# Patient Record
Sex: Female | Born: 1990
Health system: Southern US, Community
[De-identification: ages and names within clinical notes are randomized; demographics above are authoritative.]

## PROBLEM LIST (undated history)

## (undated) DIAGNOSIS — K589 Irritable bowel syndrome without diarrhea: Secondary | ICD-10-CM

## (undated) DIAGNOSIS — J45909 Unspecified asthma, uncomplicated: Secondary | ICD-10-CM

## (undated) DIAGNOSIS — F419 Anxiety disorder, unspecified: Secondary | ICD-10-CM

## (undated) HISTORY — PX: SINUS SURGERY WITH INSTATRAK: SHX5215

## (undated) HISTORY — PX: COLONOSCOPY: SHX174

## (undated) HISTORY — PX: WISDOM TOOTH EXTRACTION: SHX21

---

## 2016-10-09 NOTE — L&D Delivery Note (Signed)
Delivery Note At 12:19 AM a viable female was delivered via Vaginal, Spontaneous Delivery (Presentation:OA ;  ).  APGAR: 7, 8; weight  .   Placenta status:spont>>intact , .  Cord:  with the following complications: .  Cord pH: not sent  Anesthesia:  epid Episiotomy: None Lacerations:partial 3rd deg, rectum intact>>repaired in layers w/ sphincter rmm + caps 2-o vicryl  Suture Repair: 3.0 vicryl vicryl rapide Est. Blood Loss (mL):  300  Mom to postpartum.  Baby to Couplet care / Skin to Skin.  Michaela Sharp,Jaamal Farooqui M 08/02/2017, 12:43 AM

## 2016-10-19 DIAGNOSIS — Z01419 Encounter for gynecological examination (general) (routine) without abnormal findings: Secondary | ICD-10-CM | POA: Diagnosis not present

## 2016-10-19 DIAGNOSIS — Z1389 Encounter for screening for other disorder: Secondary | ICD-10-CM | POA: Diagnosis not present

## 2016-12-22 LAB — OB RESULTS CONSOLE GC/CHLAMYDIA
Chlamydia: NEGATIVE
Gonorrhea: NEGATIVE

## 2016-12-27 LAB — OB RESULTS CONSOLE RPR: RPR: NONREACTIVE

## 2016-12-27 LAB — OB RESULTS CONSOLE RUBELLA ANTIBODY, IGM: Rubella: IMMUNE

## 2016-12-27 LAB — OB RESULTS CONSOLE HEPATITIS B SURFACE ANTIGEN: HEP B S AG: NEGATIVE

## 2017-05-24 LAB — OB RESULTS CONSOLE HIV ANTIBODY (ROUTINE TESTING): HIV: NONREACTIVE

## 2017-07-04 LAB — OB RESULTS CONSOLE GBS: STREP GROUP B AG: NEGATIVE

## 2017-07-21 ENCOUNTER — Encounter (HOSPITAL_COMMUNITY): Payer: Self-pay | Admitting: *Deleted

## 2017-07-21 ENCOUNTER — Inpatient Hospital Stay (HOSPITAL_COMMUNITY)
Admission: AD | Admit: 2017-07-21 | Discharge: 2017-07-21 | Disposition: A | Discharge status: Home / Self Care | Payer: Managed Care, Other (non HMO) | Source: Ambulatory Visit | Attending: Obstetrics and Gynecology | Admitting: Obstetrics and Gynecology

## 2017-07-21 DIAGNOSIS — Z3493 Encounter for supervision of normal pregnancy, unspecified, third trimester: Secondary | ICD-10-CM

## 2017-07-21 DIAGNOSIS — Z0371 Encounter for suspected problem with amniotic cavity and membrane ruled out: Secondary | ICD-10-CM

## 2017-07-21 DIAGNOSIS — Z3A38 38 weeks gestation of pregnancy: Secondary | ICD-10-CM | POA: Diagnosis not present

## 2017-07-21 DIAGNOSIS — O26893 Other specified pregnancy related conditions, third trimester: Secondary | ICD-10-CM | POA: Insufficient documentation

## 2017-07-21 HISTORY — DX: Unspecified asthma, uncomplicated: J45.909

## 2017-07-21 HISTORY — DX: Anxiety disorder, unspecified: F41.9

## 2017-07-21 HISTORY — DX: Irritable bowel syndrome without diarrhea: K58.9

## 2017-07-21 LAB — POCT FERN TEST: POCT FERN TEST: NEGATIVE

## 2017-07-21 NOTE — MAU Note (Signed)
Pt reports she had a gush of fluid around 8:30 this morning.  Clear fliud. Has had a pink tinged discharge since yesterday. Good fetal movement. Not c/o contractions at this time.

## 2017-07-21 NOTE — Discharge Instructions (Signed)

## 2017-07-21 NOTE — MAU Provider Note (Signed)
S: Ms. Michaela Sharp is a 26 y.o. G1P0 at [redacted]w[redacted]d  who presents to MAU today complaining of leaking of fluid since 08;30. She denies vaginal bleeding. She denies contractions. She reports normal fetal movement.    O: BP 112/73   Pulse (!) 102   Temp 98.3 F (36.8 C) (Oral)   Resp 18  GENERAL: Well-developed, well-nourished female in no acute distress.  HEAD: Normocephalic, atraumatic.  CHEST: Normal effort of breathing, regular heart rate ABDOMEN: Soft, nontender, gravid PELVIC: Normal external female genitalia. Vagina is pink and rugated. Cervix with normal contour, no lesions. Normal discharge.  no pooling.   Cervical exam:  Dilation: 2 Effacement (%): 70 Exam by:: d lawson cnm   Fetal Monitoring: Baseline: 125 Variability: mod Accelerations: present Decelerations: none Contractions: occasional  Results for orders placed or performed during the hospital encounter of 07/21/17 (from the past 24 hour(s))  Fern Test     Status: None   Collection Time: 07/21/17 11:25 AM  Result Value Ref Range   POCT Fern Test Negative = intact amniotic membranes      A: [facility] at [redacted]w[redacted]d  Membranes intact  P: Report given to RN to contact MD on call for further instructions  Montez Morita, CNM 07/21/2017 11:27 AM

## 2017-07-25 ENCOUNTER — Telehealth (HOSPITAL_COMMUNITY): Payer: Self-pay | Admitting: *Deleted

## 2017-08-01 ENCOUNTER — Inpatient Hospital Stay (HOSPITAL_COMMUNITY): Admission: AD | Admit: 2017-08-01 | Payer: Self-pay | Source: Ambulatory Visit | Admitting: Obstetrics & Gynecology

## 2017-08-01 ENCOUNTER — Encounter (HOSPITAL_COMMUNITY): Payer: Self-pay

## 2017-08-01 ENCOUNTER — Inpatient Hospital Stay (HOSPITAL_COMMUNITY): Payer: Managed Care, Other (non HMO) | Admitting: Anesthesiology

## 2017-08-01 ENCOUNTER — Inpatient Hospital Stay (HOSPITAL_COMMUNITY)
Admission: AD | Admit: 2017-08-01 | Discharge: 2017-08-03 | DRG: 768 | Disposition: A | Discharge status: Home / Self Care | Payer: Managed Care, Other (non HMO) | Source: Ambulatory Visit | Attending: Obstetrics and Gynecology | Admitting: Obstetrics and Gynecology

## 2017-08-01 DIAGNOSIS — O26893 Other specified pregnancy related conditions, third trimester: Secondary | ICD-10-CM | POA: Diagnosis present

## 2017-08-01 DIAGNOSIS — K589 Irritable bowel syndrome without diarrhea: Secondary | ICD-10-CM | POA: Diagnosis present

## 2017-08-01 DIAGNOSIS — Z3A40 40 weeks gestation of pregnancy: Secondary | ICD-10-CM

## 2017-08-01 DIAGNOSIS — Z3490 Encounter for supervision of normal pregnancy, unspecified, unspecified trimester: Secondary | ICD-10-CM

## 2017-08-01 DIAGNOSIS — O9962 Diseases of the digestive system complicating childbirth: Principal | ICD-10-CM | POA: Diagnosis present

## 2017-08-01 LAB — CBC
HCT: 34.1 % — ABNORMAL LOW (ref 36.0–46.0)
Hemoglobin: 11.6 g/dL — ABNORMAL LOW (ref 12.0–15.0)
MCH: 31.8 pg (ref 26.0–34.0)
MCHC: 34 g/dL (ref 30.0–36.0)
MCV: 93.4 fL (ref 78.0–100.0)
PLATELETS: 287 10*3/uL (ref 150–400)
RBC: 3.65 MIL/uL — ABNORMAL LOW (ref 3.87–5.11)
RDW: 13.4 % (ref 11.5–15.5)
WBC: 11.7 10*3/uL — ABNORMAL HIGH (ref 4.0–10.5)

## 2017-08-01 LAB — TYPE AND SCREEN
ABO/RH(D): A POS
Antibody Screen: NEGATIVE

## 2017-08-01 LAB — ABO/RH: ABO/RH(D): A POS

## 2017-08-01 LAB — POCT FERN TEST: POCT Fern Test: POSITIVE

## 2017-08-01 MED ORDER — LACTATED RINGERS IV SOLN
500.0000 mL | Freq: Once | INTRAVENOUS | Status: AC
  Administered 2017-08-01: 500 mL via INTRAVENOUS

## 2017-08-01 MED ORDER — TERBUTALINE SULFATE 1 MG/ML IJ SOLN
0.2500 mg | Freq: Once | INTRAMUSCULAR | Status: DC | PRN
  Filled 2017-08-01: qty 1

## 2017-08-01 MED ORDER — BUPIVACAINE HCL (PF) 0.25 % IJ SOLN
INTRAMUSCULAR | Status: DC | PRN
  Administered 2017-08-01: 4 mL via EPIDURAL

## 2017-08-01 MED ORDER — ONDANSETRON HCL 4 MG/2ML IJ SOLN: 4.0000 mg | Freq: Four times a day (QID) | INTRAMUSCULAR | Status: DC | PRN

## 2017-08-01 MED ORDER — EPHEDRINE 5 MG/ML INJ
10.0000 mg | INTRAVENOUS | Status: DC | PRN
  Filled 2017-08-01: qty 2

## 2017-08-01 MED ORDER — FLEET ENEMA 7-19 GM/118ML RE ENEM: 1.0000 | ENEMA | RECTAL | Status: DC | PRN

## 2017-08-01 MED ORDER — PHENYLEPHRINE 40 MCG/ML (10ML) SYRINGE FOR IV PUSH (FOR BLOOD PRESSURE SUPPORT)
PREFILLED_SYRINGE | INTRAVENOUS | Status: AC
  Filled 2017-08-01: qty 20

## 2017-08-01 MED ORDER — LACTATED RINGERS IV SOLN: 500.0000 mL | INTRAVENOUS | Status: DC | PRN

## 2017-08-01 MED ORDER — LIDOCAINE HCL (PF) 1 % IJ SOLN
30.0000 mL | INTRAMUSCULAR | Status: DC | PRN
  Filled 2017-08-01: qty 30

## 2017-08-01 MED ORDER — FENTANYL 2.5 MCG/ML BUPIVACAINE 1/10 % EPIDURAL INFUSION (WH - ANES)
14.0000 mL/h | INTRAMUSCULAR | Status: DC | PRN
  Administered 2017-08-01 (×3): 14 mL/h via EPIDURAL
  Filled 2017-08-01 (×2): qty 100

## 2017-08-01 MED ORDER — DIPHENHYDRAMINE HCL 50 MG/ML IJ SOLN: 12.5000 mg | INTRAMUSCULAR | Status: DC | PRN

## 2017-08-01 MED ORDER — FENTANYL 2.5 MCG/ML BUPIVACAINE 1/10 % EPIDURAL INFUSION (WH - ANES)
INTRAMUSCULAR | Status: AC
  Filled 2017-08-01: qty 100

## 2017-08-01 MED ORDER — OXYTOCIN BOLUS FROM INFUSION
500.0000 mL | Freq: Once | INTRAVENOUS | Status: AC
  Administered 2017-08-02: 500 mL via INTRAVENOUS

## 2017-08-01 MED ORDER — LACTATED RINGERS IV SOLN
INTRAVENOUS | Status: DC
  Administered 2017-08-01 (×2): via INTRAVENOUS

## 2017-08-01 MED ORDER — LIDOCAINE HCL (PF) 1 % IJ SOLN
INTRAMUSCULAR | Status: DC | PRN
  Administered 2017-08-01 (×2): 4 mL

## 2017-08-01 MED ORDER — ACETAMINOPHEN 325 MG PO TABS: 650.0000 mg | ORAL_TABLET | ORAL | Status: DC | PRN

## 2017-08-01 MED ORDER — SODIUM BICARBONATE 8.4 % IV SOLN
INTRAVENOUS | Status: DC | PRN
  Administered 2017-08-01: 4 mL via EPIDURAL

## 2017-08-01 MED ORDER — FENTANYL 2.5 MCG/ML BUPIVACAINE 1/10 % EPIDURAL INFUSION (WH - ANES): 14.0000 mL/h | INTRAMUSCULAR | Status: DC | PRN

## 2017-08-01 MED ORDER — PHENYLEPHRINE 40 MCG/ML (10ML) SYRINGE FOR IV PUSH (FOR BLOOD PRESSURE SUPPORT)
80.0000 ug | PREFILLED_SYRINGE | INTRAVENOUS | Status: DC | PRN
  Filled 2017-08-01: qty 5

## 2017-08-01 MED ORDER — OXYTOCIN 40 UNITS IN LACTATED RINGERS INFUSION - SIMPLE MED
2.5000 [IU]/h | INTRAVENOUS | Status: DC
  Filled 2017-08-01: qty 1000

## 2017-08-01 MED ORDER — OXYCODONE-ACETAMINOPHEN 5-325 MG PO TABS: 1.0000 | ORAL_TABLET | ORAL | Status: DC | PRN

## 2017-08-01 MED ORDER — SOD CITRATE-CITRIC ACID 500-334 MG/5ML PO SOLN
30.0000 mL | ORAL | Status: DC | PRN
  Administered 2017-08-01: 30 mL via ORAL
  Filled 2017-08-01: qty 15

## 2017-08-01 MED ORDER — OXYTOCIN 40 UNITS IN LACTATED RINGERS INFUSION - SIMPLE MED
1.0000 m[IU]/min | INTRAVENOUS | Status: DC
  Administered 2017-08-01: 2 m[IU]/min via INTRAVENOUS

## 2017-08-01 MED ORDER — OXYCODONE-ACETAMINOPHEN 5-325 MG PO TABS: 2.0000 | ORAL_TABLET | ORAL | Status: DC | PRN

## 2017-08-01 NOTE — Anesthesia Preprocedure Evaluation (Signed)
Anesthesia Evaluation  Patient identified by MRN, date of birth, ID band Patient awake    Reviewed: Allergy & Precautions, NPO status , Patient's Chart, lab work & pertinent test results  Airway Mallampati: II  TM Distance: >3 FB Neck ROM: Full    Dental no notable dental hx.    Pulmonary asthma ,    Pulmonary exam normal breath sounds clear to auscultation       Cardiovascular negative cardio ROS Normal cardiovascular exam Rhythm:Regular Rate:Normal     Neuro/Psych PSYCHIATRIC DISORDERS Anxiety negative neurological ROS     GI/Hepatic negative GI ROS, Neg liver ROS,   Endo/Other  negative endocrine ROS  Renal/GU negative Renal ROS     Musculoskeletal negative musculoskeletal ROS (+)   Abdominal   Peds  Hematology negative hematology ROS (+)   Anesthesia Other Findings   Reproductive/Obstetrics (+) Pregnancy                             Anesthesia Physical Anesthesia Plan  ASA: II  Anesthesia Plan: Epidural   Post-op Pain Management:    Induction:   PONV Risk Score and Plan:   Airway Management Planned:   Additional Equipment:   Intra-op Plan:   Post-operative Plan:   Informed Consent: I have reviewed the patients History and Physical, chart, labs and discussed the procedure including the risks, benefits and alternatives for the proposed anesthesia with the patient or authorized representative who has indicated his/her understanding and acceptance.     Plan Discussed with:   Anesthesia Plan Comments:         Anesthesia Quick Evaluation

## 2017-08-01 NOTE — Anesthesia Pain Management Evaluation Note (Signed)
  CRNA Pain Management Visit Note  Patient: Michaela Sharp, 26 y.o., female  "Hello I am a member of the anesthesia team at [facility]. We have an anesthesia team available at all times to provide care throughout the hospital, including epidural management and anesthesia for C-section. I don't know your plan for the delivery whether it a natural birth, water birth, IV sedation, nitrous supplementation, doula or epidural, but we want to meet your pain goals."   1.Was your pain managed to your expectations on prior hospitalizations?   No prior hospitalizations  2.What is your expectation for pain management during this hospitalization?     Epidural  3.How can we help you reach that goal? Epidural in place and working well.  Record the patient's initial score and the patient's pain goal.   Pain: 0  Pain Goal: 4 [facility] wants you to be able to say your pain was always managed very well.  Mendy Chou 08/01/2017

## 2017-08-01 NOTE — Anesthesia Procedure Notes (Signed)
Epidural Patient location during procedure: OB  Staffing Anesthesiologist: Gleb Mcguire Performed: anesthesiologist   Preanesthetic Checklist Completed: patient identified, pre-op evaluation, timeout performed, IV checked, risks and benefits discussed and monitors and equipment checked  Epidural Patient position: sitting Prep: site prepped and draped and DuraPrep Patient monitoring: heart rate, continuous pulse ox and blood pressure Approach: midline Location: L3-L4 Injection technique: LOR air and LOR saline  Needle:  Needle type: Tuohy  Needle gauge: 17 G Needle length: 9 cm Needle insertion depth: 6 cm Catheter type: closed end flexible Catheter size: 19 Gauge Catheter at skin depth: 11 cm Test dose: negative  Assessment Sensory level: T8 Events: blood not aspirated, injection not painful, no injection resistance, negative IV test and no paresthesia  Additional Notes Reason for block:procedure for pain     

## 2017-08-01 NOTE — MAU Note (Addendum)
Started leaking 1045, watery fluid with mucous, constant clear trickle since.  Some cramping, tightness. was 2/80 last wk

## 2017-08-01 NOTE — H&P (Signed)
Sander RadonLauren Montesdeoca is a [age] y.o. female presenting for SROM. OB History    Gravida Para Term Preterm AB Living   1             SAB TAB Ectopic Multiple Live Births                 Past Medical History:  Diagnosis Date  . Anxiety   . Asthma    stress induced  . IBS (irritable bowel syndrome)    Past Surgical History:  Procedure Laterality Date  . COLONOSCOPY    . SINUS SURGERY WITH INSTATRAK    . WISDOM TOOTH EXTRACTION     Family History: family history includes Diabetes in her maternal grandfather. Social History:  reports that she has never smoked. She has never used smokeless tobacco. She reports that she does not drink alcohol or use drugs.     Maternal Diabetes: No Genetic Screening: Normal Maternal Ultrasounds/Referrals: Normal Fetal Ultrasounds or other Referrals:  None Maternal Substance Abuse:  No Significant Maternal Medications:  None Significant Maternal Lab Results:  None Other Comments:  None  ROS History Dilation: 3 Effacement (%): 80 Station: -3 Exam by:: Dr. Marcelle OverlieHolland Blood pressure 131/76, pulse 96, temperature 98.7 F (37.1 C), temperature source Oral, resp. rate 18, height 5\' 4"  (1.626 m), weight 163 lb (73.9 kg), SpO2 98 %. Exam Physical Exam  Constitutional: She is oriented to person, place, and time. She appears well-developed and well-nourished.  HENT:  Head: Normocephalic and atraumatic.  Neck: Normal range of motion. Neck supple.  Cardiovascular: Normal rate and regular rhythm.   Respiratory: Effort normal and breath sounds normal.  GI:  Term FH, FHR 142  Genitourinary:  Genitourinary Comments: 3/80/-2, clr AF/VTX  Musculoskeletal: Normal range of motion.  Neurological: She is alert and oriented to person, place, and time.    Prenatal labs: ABO, Rh:   Antibody:   Rubella:   RPR:    HBsAg:    HIV:    GBS:     Assessment/Plan: Term IUP, SROM + ealy labor   Jayvien Rowlette M 08/01/2017, 2:27 PM

## 2017-08-02 ENCOUNTER — Encounter (HOSPITAL_COMMUNITY): Payer: Self-pay | Admitting: *Deleted

## 2017-08-02 DIAGNOSIS — Z3490 Encounter for supervision of normal pregnancy, unspecified, unspecified trimester: Secondary | ICD-10-CM

## 2017-08-02 LAB — CBC
HCT: 26.9 % — ABNORMAL LOW (ref 36.0–46.0)
Hemoglobin: 9.2 g/dL — ABNORMAL LOW (ref 12.0–15.0)
MCH: 32.1 pg (ref 26.0–34.0)
MCHC: 34.2 g/dL (ref 30.0–36.0)
MCV: 93.7 fL (ref 78.0–100.0)
PLATELETS: 231 10*3/uL (ref 150–400)
RBC: 2.87 MIL/uL — AB (ref 3.87–5.11)
RDW: 13.6 % (ref 11.5–15.5)
WBC: 21 10*3/uL — AB (ref 4.0–10.5)

## 2017-08-02 LAB — RPR: RPR: NONREACTIVE

## 2017-08-02 MED ORDER — SIMETHICONE 80 MG PO CHEW: 80.0000 mg | CHEWABLE_TABLET | ORAL | Status: DC | PRN

## 2017-08-02 MED ORDER — FLEET ENEMA 7-19 GM/118ML RE ENEM: 1.0000 | ENEMA | Freq: Every day | RECTAL | Status: DC | PRN

## 2017-08-02 MED ORDER — COCONUT OIL OIL: 1.0000 "application " | TOPICAL_OIL | Status: DC | PRN

## 2017-08-02 MED ORDER — OXYCODONE-ACETAMINOPHEN 5-325 MG PO TABS
1.0000 | ORAL_TABLET | ORAL | Status: DC | PRN
  Administered 2017-08-02 – 2017-08-03 (×4): 1 via ORAL
  Filled 2017-08-02 (×4): qty 1

## 2017-08-02 MED ORDER — BISACODYL 10 MG RE SUPP
10.0000 mg | Freq: Every day | RECTAL | Status: DC | PRN
  Administered 2017-08-03: 10 mg via RECTAL
  Filled 2017-08-02: qty 1

## 2017-08-02 MED ORDER — ONDANSETRON HCL 4 MG/2ML IJ SOLN: 4.0000 mg | INTRAMUSCULAR | Status: DC | PRN

## 2017-08-02 MED ORDER — BENZOCAINE-MENTHOL 20-0.5 % EX AERO
1.0000 "application " | INHALATION_SPRAY | CUTANEOUS | Status: DC | PRN
  Administered 2017-08-02: 1 via TOPICAL
  Filled 2017-08-02: qty 56

## 2017-08-02 MED ORDER — ACETAMINOPHEN 325 MG PO TABS
650.0000 mg | ORAL_TABLET | ORAL | Status: DC | PRN
Start: 2017-08-02 — End: 2017-08-04
  Administered 2017-08-02: 650 mg via ORAL
  Filled 2017-08-02: qty 2

## 2017-08-02 MED ORDER — TETANUS-DIPHTH-ACELL PERTUSSIS 5-2.5-18.5 LF-MCG/0.5 IM SUSP: 0.5000 mL | Freq: Once | INTRAMUSCULAR | Status: DC

## 2017-08-02 MED ORDER — ONDANSETRON HCL 4 MG PO TABS: 4.0000 mg | ORAL_TABLET | ORAL | Status: DC | PRN

## 2017-08-02 MED ORDER — DIBUCAINE 1 % RE OINT: 1.0000 "application " | TOPICAL_OINTMENT | RECTAL | Status: DC | PRN

## 2017-08-02 MED ORDER — OXYCODONE-ACETAMINOPHEN 5-325 MG PO TABS
2.0000 | ORAL_TABLET | ORAL | Status: DC | PRN
Start: 2017-08-02 — End: 2017-08-04

## 2017-08-02 MED ORDER — SENNOSIDES-DOCUSATE SODIUM 8.6-50 MG PO TABS
2.0000 | ORAL_TABLET | ORAL | Status: DC
  Administered 2017-08-02: 2 via ORAL
  Filled 2017-08-02: qty 2

## 2017-08-02 MED ORDER — ZOLPIDEM TARTRATE 5 MG PO TABS: 5.0000 mg | ORAL_TABLET | Freq: Every evening | ORAL | Status: DC | PRN

## 2017-08-02 MED ORDER — IBUPROFEN 800 MG PO TABS
800.0000 mg | ORAL_TABLET | Freq: Three times a day (TID) | ORAL | Status: DC | PRN
  Administered 2017-08-02 – 2017-08-03 (×4): 800 mg via ORAL
  Filled 2017-08-02 (×4): qty 1

## 2017-08-02 MED ORDER — WITCH HAZEL-GLYCERIN EX PADS: 1.0000 "application " | MEDICATED_PAD | CUTANEOUS | Status: DC | PRN

## 2017-08-02 MED ORDER — DIPHENHYDRAMINE HCL 25 MG PO CAPS: 25.0000 mg | ORAL_CAPSULE | Freq: Four times a day (QID) | ORAL | Status: DC | PRN

## 2017-08-02 MED ORDER — MEASLES, MUMPS & RUBELLA VAC ~~LOC~~ INJ
0.5000 mL | INJECTION | Freq: Once | SUBCUTANEOUS | Status: DC
  Filled 2017-08-02: qty 0.5

## 2017-08-02 MED ORDER — PRENATAL MULTIVITAMIN CH
1.0000 | ORAL_TABLET | Freq: Every day | ORAL | Status: DC
  Administered 2017-08-02 – 2017-08-03 (×2): 1 via ORAL
  Filled 2017-08-02 (×2): qty 1

## 2017-08-02 NOTE — Plan of Care (Signed)
Problem: Coping: Goal: Ability to cope will improve Outcome: Progressing Will assess patient's [place] depression scale and follow up as needed.

## 2017-08-02 NOTE — Progress Notes (Signed)
Post Partum Day 0 Subjective: no complaints, up ad lib and tolerating PO  Objective: Blood pressure (!) 107/54, pulse 98, temperature 98.4 F (36.9 C), temperature source Oral, resp. rate 16, height 5\' 4"  (1.626 m), weight 163 lb (73.9 kg), SpO2 99 %, unknown if currently breastfeeding.  Physical Exam:  General: alert and cooperative Lochia: appropriate Uterine Fundus: firm Incision: n/a DVT Evaluation: No evidence of DVT seen on physical exam.   Recent Labs  08/01/17 1355 08/02/17 0532  HGB 11.6* 9.2*  HCT 34.1* 26.9*    Assessment/Plan: Continue current care Pt unsure re: circ - wants to check insurance coverage   LOS: 1 day   Phaedra Colgate 08/02/2017, 9:02 AM

## 2017-08-02 NOTE — Anesthesia Postprocedure Evaluation (Signed)
Anesthesia Post Note  Patient: Michaela Sharp  Procedure(s) Performed: AN AD HOC LABOR EPIDURAL     Patient location during evaluation: Mother Baby Anesthesia Type: Epidural Level of consciousness: awake and alert Pain management: pain level controlled Vital Signs Assessment: post-procedure vital signs reviewed and stable Respiratory status: spontaneous breathing, nonlabored ventilation and respiratory function stable Cardiovascular status: stable Postop Assessment: no headache, no backache and epidural receding Anesthetic complications: no    Last Vitals:  Vitals:   08/02/17 0225 08/02/17 0328  BP: 110/63 (!) 107/54  Pulse: 96 98  Resp: 16 16  Temp: 37.2 C 36.9 C  SpO2:      Last Pain:  Vitals:   08/02/17 0628  TempSrc:   PainSc: 3    Pain Goal: Patients Stated Pain Goal: 8 (08/01/17 1424)               Junious SilkGILBERT,[name]

## 2017-08-02 NOTE — Lactation Note (Signed)
This note was copied from a baby's chart. Lactation Consultation Note  Patient Name: Michaela Sander RadonLauren Morrissey UEAVW'UToday's Date: 08/02/2017 Reason for consult: Initial assessment   Initial assessment with first time mom of 12 hour old infant. Infant with 3 BF for 10-30 minutes, 3 BF attempts, 3 voids and 5 stools since birth. Infant weight 7 lb 4.4 oz   Mom reports she feels BF is going well. Infant has been sleeping a lot today. Enc mom to place infant STS every few hours to encourage feedings. Enc mom to feed infant STS 8-12 x in 24 hours at first feeding cues using pillows and head support. Enc mom to allow infant to finished one feeding before offering second breast. Discussed supply and demand, milk coming to volume, hand expression, infant stomach size and NB nutritional needs. Mom is able to hand express colostrum.   Mom reports + breast changes with pregnancy. Mom latched infant to the left breast independently. Infant was still feeding when LC left room. Enc mom to massage/comperss breast with feeding.   BF Resources Handout and LC Brochure given, mom informed of IP/OP services, BF Support Groups and LC phone #. Mom has a Spectra 2 pump for home use. All questions answered for parents. Enc mom to call out for feeding assistance as needed.    Maternal Data Formula Feeding for Exclusion: No Has patient been taught Hand Expression?: Yes Does the patient have breastfeeding experience prior to this delivery?: No  Feeding Feeding Type: Breast Fed Length of feed: 10 min (Still feeding when LC left room)  LATCH Score Latch: Grasps breast easily, tongue down, lips flanged, rhythmical sucking.  Audible Swallowing: Spontaneous and intermittent  Type of Nipple: Everted at rest and after stimulation  Comfort (Breast/Nipple): Soft / non-tender  Hold (Positioning): Assistance needed to correctly position infant at breast and maintain latch.  LATCH Score: 9  Interventions Interventions: Breast  feeding basics reviewed;Support pillows;Assisted with latch;Position options;Skin to skin;Expressed milk;Breast compression;Breast massage  Lactation Tools Discussed/Used WIC Program: No   Consult Status Consult Status: Follow-up Date: 08/03/17 Follow-up type: In-patient    Silas FloodSharon S Bodhi Moradi 08/02/2017, 12:50 PM

## 2017-08-03 MED ORDER — IBUPROFEN 800 MG PO TABS: 800.0000 mg | ORAL_TABLET | Freq: Three times a day (TID) | ORAL | 0 refills | Status: DC | PRN

## 2017-08-03 MED ORDER — OXYCODONE-ACETAMINOPHEN 5-325 MG PO TABS: 1.0000 | ORAL_TABLET | ORAL | 0 refills | Status: DC | PRN

## 2017-08-03 NOTE — Discharge Summary (Signed)
Obstetric Discharge Summary Reason for Admission: onset of labor Prenatal Procedures: none Intrapartum Procedures: spontaneous vaginal delivery Postpartum Procedures: none Complications-Operative and Postpartum: 3 degree perineal laceration Hemoglobin  Date Value Ref Range Status  08/02/2017 9.2 (L) 12.0 - 15.0 g/dL Final    Comment:    DELTA CHECK NOTED REPEATED TO VERIFY    HCT  Date Value Ref Range Status  08/02/2017 26.9 (L) 36.0 - 46.0 % Final    Physical Exam:  General: alert, cooperative, appears stated age and mild distress Lochia: appropriate Uterine Fundus: firm Incision: healing well DVT Evaluation: No evidence of DVT seen on physical exam.  Discharge Diagnoses: Term Pregnancy-delivered  Discharge Information: Date: 08/03/2017 Activity: pelvic rest Diet: routine Medications: Ibuprofen and Percocet Condition: stable Instructions: refer to practice specific booklet Discharge to: home   Newborn Data: Live born female  Birth Weight: 7 lb 4.4 oz (3300 g) APGAR: 7, 8  Newborn Delivery   Birth date/time:  08/02/2017 00:19:00 Delivery type:  Vaginal, Spontaneous Delivery      Home with mother.  Michaela Sharp 08/03/2017, 5:11 PM

## 2017-08-03 NOTE — Progress Notes (Signed)
Post Partum Day 1 Subjective: no complaints, up ad lib, voiding, tolerating PO and + flatus  Objective: Blood pressure 104/72, pulse 83, temperature 98.1 F (36.7 C), temperature source Oral, resp. rate 20, height 5\' 4"  (1.626 m), weight 163 lb (73.9 kg), SpO2 99 %, unknown if currently breastfeeding.  Physical Exam:  General: alert, cooperative, appears stated age and no distress Lochia: appropriate Uterine Fundus: firm Incision: healing well DVT Evaluation: No evidence of DVT seen on physical exam.   Recent Labs  08/01/17 1355 08/02/17 0532  HGB 11.6* 9.2*  HCT 34.1* 26.9*    Assessment/Plan: Plan for discharge tomorrow, Breastfeeding and Circumcision prior to discharge   LOS: 2 days   Michaela Sharp C 08/03/2017, 9:14 AM

## 2017-08-03 NOTE — Lactation Note (Signed)
This note was copied from a baby's chart. Lactation Consultation Note  Patient Name: Michaela Sharp WUJWJ'XToday's Date: 08/03/2017 Reason for consult: Initial assessment;Primapara;1st time breastfeeding;Nipple pain/trauma   Follow up with mom of 38 hour old infant. Infant with 7 BF for 10-11 minutes, EBM x 5 1-4 cc, 3 voids and 5 stools in last 24 hours. Infant weight 6 lb 11.9 oz with 7% weight loss since birth. LATCH scores 3-8.   Mom reports she feels BF is improving and infant is staying on longer. Mom reports her nipples are sore and that her nipples are compressed when infant comes off the breast. Mom reports she is not using the NS. Discussed with mom that since nipples are compressed with feeding, it may be decreasing milk transfer. Enc mom to use NS. Enc mom to use coconut oil to nipples.   Mom was in the middle of feeding infant. Infant was latched to left breast in the football hold by mom. He latched after a few tries. Infant was noted to have intermittent swallows. We placed # 20 NS and mom reports tenderness/pinching still present. Placed # 24 NS and infant would not stay latched well. Mom then hand expressed 1-2 cc colostrum and was primed into NS, infant was then relatched to right breast in the football hold. Infant latched and fed for about 10 minutes and then turned loose. Discussed NS being a barrier between mom and infant. Advised mom to pump 4-6 x a day post BF to protect milk supply. Mom is to use her Spectra pump.Enc mom to give infant all EBM that is expressed. Mom was shown how to apply, prime, and clean nipple shield. Parents report infant may be d/c home today after circumcision.   Reviewed I/O, Signs of dehydration in the infant, Engorgement prevention/treatment, and breast milk handling and storage. LC Brochure reviewed, mom advosed of OP services, BF support Groups and LC phone #. Mom wants to come back for follow up OP appt next week. In basket [facility] to call mom and schedule OP appt next week. Parents report they have no further questions/concerns at this time.    Maternal Data Formula Feeding for Exclusion: No Has patient been taught Hand Expression?: Yes Does the patient have breastfeeding experience prior to this delivery?: No  Feeding Feeding Type: Breast Fed Length of feed: 15 min  LATCH Score Latch: Repeated attempts needed to sustain latch, nipple held in mouth throughout feeding, stimulation needed to elicit sucking reflex.  Audible Swallowing: A few with stimulation  Type of Nipple: Everted at rest and after stimulation  Comfort (Breast/Nipple): Filling, red/small blisters or bruises, mild/mod discomfort  Hold (Positioning): Assistance needed to correctly position infant at breast and maintain latch.  LATCH Score: 6  Interventions Interventions: Breast feeding basics reviewed;Support pillows;Assisted with latch;Position options;Skin to skin;Expressed milk;Coconut oil;Breast compression  Lactation Tools Discussed/Used Tools: Shells;Pump;Coconut oil;Nipple Shields Nipple shield size: 20;24 Shell Type: Inverted Breast pump type: Manual Pump Review: Setup, frequency, and cleaning;Milk Storage Initiated by:: Reviewed and encouraged pumping 4-6 x a day   Consult Status Consult Status: Follow-up Date: 08/04/17 Follow-up type: In-patient    Michaela Sharp 08/03/2017, 3:24 PM

## 2017-08-03 NOTE — Lactation Note (Signed)
This note was copied from a baby's chart. Lactation Consultation Note Baby frustrated wanting to feed, fighting, pushing away. Mom has short shaft nipples. Latching to breast painful to mom.fitted #20 NS, applied colostrum to NS baby wouldn't take, crying. Baby had soiled diaper. Mom getting frustrated. Asked not to use NS. Attempted to breast. Baby wouldn't suckle. Encouraged mom to do STS. Gave mom shells to evert nipples, hand pump to pre-pump. Mom thankful for assistance. Gave report to RN and bullet to collect colostrum.  Patient Name: Michaela Sander RadonLauren Tat ZOXWR'UToday's Date: 08/03/2017 Reason for consult: Initial assessment   Maternal Data    Feeding Feeding Type: Breast Fed Length of feed: 0 min  LATCH Score Latch: Too sleepy or reluctant, no latch achieved, no sucking elicited.  Audible Swallowing: None  Type of Nipple: Everted at rest and after stimulation  Comfort (Breast/Nipple): Filling, red/small blisters or bruises, mild/mod discomfort  Hold (Positioning): Full assist, staff holds infant at breast  LATCH Score: 3  Interventions Interventions: Breast feeding basics reviewed;Breast compression;Assisted with latch;Adjust position;Hand pump;Skin to skin;Support pillows;Position options;Breast massage;Hand express;Expressed milk;Pre-pump if needed;Shells  Lactation Tools Discussed/Used Tools: Shells;Pump;Nipple Shields Nipple shield size: 16;20 Shell Type: Inverted Breast pump type: Manual Pump Review: Setup, frequency, and cleaning;Milk Storage Initiated by:: LMargo Aye. CarverRN IBCLC Date initiated:: 08/03/17   Consult Status Consult Status: Follow-up Date: 08/03/17 Follow-up type: In-patient    Charyl DancerCARVER, Jazmen Lindenbaum G 08/03/2017, 5:44 AM

## 2017-08-03 NOTE — Lactation Note (Signed)
This note was copied from a baby's chart. Lactation Consultation Note Attempted to visit mom 2 different times, mom was sleeping the first time, half way sleeping the 2nd time. Mom stated she is so tired. Noted BF LC pamplet at bedside.  Patient Name: Michaela Sander RadonLauren Sharp Today's Date: 08/03/2017     Maternal Data    Feeding Feeding Type: Breast Fed Length of feed: 10 min  LATCH Score Latch: Repeated attempts needed to sustain latch, nipple held in mouth throughout feeding, stimulation needed to elicit sucking reflex.  Audible Swallowing: Spontaneous and intermittent  Type of Nipple: Everted at rest and after stimulation  Comfort (Breast/Nipple): Soft / non-tender  Hold (Positioning): Assistance needed to correctly position infant at breast and maintain latch.  LATCH Score: 8  Interventions    Lactation Tools Discussed/Used     Consult Status      Eh Sesay G 08/03/2017, 4:55 AM

## 2017-08-03 NOTE — Progress Notes (Signed)
MOB was referred for history of depression/anxiety. * Referral screened out by Clinical Social Worker because none of the following criteria appear to apply: ~ History of anxiety/depression during this pregnancy, or of post-partum depression. ~ Diagnosis of anxiety and/or depression within last 3 years OR * MOB's symptoms currently being treated with medication and/or therapy. Please contact the Clinical Social Worker if needs arise, by MOB request, or if MOB scores greater than 9/yes to question 10 on Edinburgh Postpartum Depression Screen.  MOB has Rx for Zoloft. 

## 2017-08-07 ENCOUNTER — Inpatient Hospital Stay (HOSPITAL_COMMUNITY): Admission: RE | Admit: 2017-08-07 | Payer: Managed Care, Other (non HMO) | Source: Ambulatory Visit

## 2017-10-29 DIAGNOSIS — Z1339 Encounter for screening examination for other mental health and behavioral disorders: Secondary | ICD-10-CM | POA: Diagnosis not present

## 2017-10-29 DIAGNOSIS — Z6822 Body mass index (BMI) 22.0-22.9, adult: Secondary | ICD-10-CM | POA: Diagnosis not present

## 2017-10-29 DIAGNOSIS — F419 Anxiety disorder, unspecified: Secondary | ICD-10-CM | POA: Diagnosis not present

## 2017-10-29 DIAGNOSIS — Z1331 Encounter for screening for depression: Secondary | ICD-10-CM | POA: Diagnosis not present

## 2018-03-22 NOTE — Telephone Encounter (Signed)
Preadmission screen  

## 2018-04-16 DIAGNOSIS — Z6820 Body mass index (BMI) 20.0-20.9, adult: Secondary | ICD-10-CM | POA: Diagnosis not present

## 2018-04-16 DIAGNOSIS — J329 Chronic sinusitis, unspecified: Secondary | ICD-10-CM | POA: Diagnosis not present

## 2018-04-16 DIAGNOSIS — J40 Bronchitis, not specified as acute or chronic: Secondary | ICD-10-CM | POA: Diagnosis not present

## 2018-09-19 DIAGNOSIS — Z6821 Body mass index (BMI) 21.0-21.9, adult: Secondary | ICD-10-CM | POA: Diagnosis not present

## 2018-09-19 DIAGNOSIS — J329 Chronic sinusitis, unspecified: Secondary | ICD-10-CM | POA: Diagnosis not present

## 2018-09-19 DIAGNOSIS — J40 Bronchitis, not specified as acute or chronic: Secondary | ICD-10-CM | POA: Diagnosis not present

## 2018-09-27 DIAGNOSIS — B309 Viral conjunctivitis, unspecified: Secondary | ICD-10-CM | POA: Diagnosis not present

## 2018-10-15 DIAGNOSIS — Z800 Family history of malignant neoplasm of digestive organs: Secondary | ICD-10-CM | POA: Diagnosis not present

## 2018-10-15 DIAGNOSIS — Z01419 Encounter for gynecological examination (general) (routine) without abnormal findings: Secondary | ICD-10-CM | POA: Diagnosis not present

## 2018-10-15 DIAGNOSIS — Z6822 Body mass index (BMI) 22.0-22.9, adult: Secondary | ICD-10-CM | POA: Diagnosis not present

## 2018-12-27 DIAGNOSIS — F411 Generalized anxiety disorder: Secondary | ICD-10-CM | POA: Diagnosis not present

## 2018-12-27 DIAGNOSIS — F331 Major depressive disorder, recurrent, moderate: Secondary | ICD-10-CM | POA: Diagnosis not present

## 2019-02-14 DIAGNOSIS — F3342 Major depressive disorder, recurrent, in full remission: Secondary | ICD-10-CM | POA: Diagnosis not present

## 2019-07-18 DIAGNOSIS — R109 Unspecified abdominal pain: Secondary | ICD-10-CM | POA: Diagnosis not present

## 2019-07-18 DIAGNOSIS — R319 Hematuria, unspecified: Secondary | ICD-10-CM | POA: Diagnosis not present

## 2019-07-18 DIAGNOSIS — M549 Dorsalgia, unspecified: Secondary | ICD-10-CM | POA: Diagnosis not present

## 2019-07-18 DIAGNOSIS — N200 Calculus of kidney: Secondary | ICD-10-CM | POA: Diagnosis not present

## 2019-09-08 DIAGNOSIS — N911 Secondary amenorrhea: Secondary | ICD-10-CM | POA: Diagnosis not present

## 2019-09-15 DIAGNOSIS — Z3685 Encounter for antenatal screening for Streptococcus B: Secondary | ICD-10-CM | POA: Diagnosis not present

## 2019-09-15 DIAGNOSIS — Z23 Encounter for immunization: Secondary | ICD-10-CM | POA: Diagnosis not present

## 2019-09-15 DIAGNOSIS — Z3481 Encounter for supervision of other normal pregnancy, first trimester: Secondary | ICD-10-CM | POA: Diagnosis not present

## 2019-09-15 LAB — OB RESULTS CONSOLE GC/CHLAMYDIA
Chlamydia: NEGATIVE
Gonorrhea: NEGATIVE

## 2019-09-15 LAB — OB RESULTS CONSOLE RUBELLA ANTIBODY, IGM: Rubella: IMMUNE

## 2019-09-15 LAB — OB RESULTS CONSOLE HEPATITIS B SURFACE ANTIGEN: Hepatitis B Surface Ag: NEGATIVE

## 2019-09-15 LAB — OB RESULTS CONSOLE HIV ANTIBODY (ROUTINE TESTING): HIV: NONREACTIVE

## 2019-09-19 DIAGNOSIS — U071 COVID-19: Secondary | ICD-10-CM | POA: Diagnosis not present

## 2019-09-19 DIAGNOSIS — Z1159 Encounter for screening for other viral diseases: Secondary | ICD-10-CM | POA: Diagnosis not present

## 2019-09-24 DIAGNOSIS — Z3400 Encounter for supervision of normal first pregnancy, unspecified trimester: Secondary | ICD-10-CM | POA: Diagnosis not present

## 2019-09-24 DIAGNOSIS — Z113 Encounter for screening for infections with a predominantly sexual mode of transmission: Secondary | ICD-10-CM | POA: Diagnosis not present

## 2019-10-10 NOTE — L&D Delivery Note (Signed)
Delivery Note At 9:52 PM a viable female was delivered via Vaginal, Spontaneous (Presentation: Right Occiput Anterior).  APGAR: 9, 9; weight  .   Placenta status: Spontaneous, Intact. succenturiate lobe noted  Cord: 3 vessels with the following complications: None.  Cord pH: not sent Small vaginal inclusion cyst removed easily per patient request.  cytotec placed for uterine atony.   Anesthesia: Epidural Episiotomy: None Lacerations: 2nd degree Suture Repair: 3.0 vicryl Est. Blood Loss (mL): 400   It's a girl - "[place]"!!   Mom to postpartum.  Baby to Couplet care / Skin to Skin.  Ranae Pila 04/07/2020, 10:16 PM

## 2019-10-13 DIAGNOSIS — Z3682 Encounter for antenatal screening for nuchal translucency: Secondary | ICD-10-CM | POA: Diagnosis not present

## 2019-10-13 DIAGNOSIS — Z3A13 13 weeks gestation of pregnancy: Secondary | ICD-10-CM | POA: Diagnosis not present

## 2019-10-13 DIAGNOSIS — Z3481 Encounter for supervision of other normal pregnancy, first trimester: Secondary | ICD-10-CM | POA: Diagnosis not present

## 2019-11-21 DIAGNOSIS — Z361 Encounter for antenatal screening for raised alphafetoprotein level: Secondary | ICD-10-CM | POA: Diagnosis not present

## 2019-11-21 DIAGNOSIS — Z3A18 18 weeks gestation of pregnancy: Secondary | ICD-10-CM | POA: Diagnosis not present

## 2019-11-21 DIAGNOSIS — Z363 Encounter for antenatal screening for malformations: Secondary | ICD-10-CM | POA: Diagnosis not present

## 2019-12-22 DIAGNOSIS — Z3A23 23 weeks gestation of pregnancy: Secondary | ICD-10-CM | POA: Diagnosis not present

## 2019-12-22 DIAGNOSIS — Z362 Encounter for other antenatal screening follow-up: Secondary | ICD-10-CM | POA: Diagnosis not present

## 2020-01-23 DIAGNOSIS — Z23 Encounter for immunization: Secondary | ICD-10-CM | POA: Diagnosis not present

## 2020-01-23 DIAGNOSIS — O4442 Low lying placenta NOS or without hemorrhage, second trimester: Secondary | ICD-10-CM | POA: Diagnosis not present

## 2020-01-23 DIAGNOSIS — Z3A27 27 weeks gestation of pregnancy: Secondary | ICD-10-CM | POA: Diagnosis not present

## 2020-01-23 DIAGNOSIS — Z3480 Encounter for supervision of other normal pregnancy, unspecified trimester: Secondary | ICD-10-CM | POA: Diagnosis not present

## 2020-01-30 DIAGNOSIS — F3342 Major depressive disorder, recurrent, in full remission: Secondary | ICD-10-CM | POA: Diagnosis not present

## 2020-02-05 DIAGNOSIS — D649 Anemia, unspecified: Secondary | ICD-10-CM | POA: Diagnosis not present

## 2020-02-19 DIAGNOSIS — D649 Anemia, unspecified: Secondary | ICD-10-CM | POA: Diagnosis not present

## 2020-02-23 ENCOUNTER — Emergency Department (HOSPITAL_COMMUNITY)
Admission: EM | Admit: 2020-02-23 | Discharge: 2020-02-23 | Disposition: A | Discharge status: Home / Self Care | Payer: BC Managed Care – PPO | Attending: Emergency Medicine | Admitting: Emergency Medicine

## 2020-02-23 ENCOUNTER — Emergency Department (HOSPITAL_COMMUNITY): Payer: BC Managed Care – PPO

## 2020-02-23 ENCOUNTER — Encounter (HOSPITAL_COMMUNITY): Payer: Self-pay

## 2020-02-23 ENCOUNTER — Other Ambulatory Visit: Payer: Self-pay

## 2020-02-23 DIAGNOSIS — Z79899 Other long term (current) drug therapy: Secondary | ICD-10-CM | POA: Diagnosis not present

## 2020-02-23 DIAGNOSIS — J45909 Unspecified asthma, uncomplicated: Secondary | ICD-10-CM | POA: Diagnosis not present

## 2020-02-23 DIAGNOSIS — J0100 Acute maxillary sinusitis, unspecified: Secondary | ICD-10-CM

## 2020-02-23 DIAGNOSIS — R519 Headache, unspecified: Secondary | ICD-10-CM | POA: Diagnosis not present

## 2020-02-23 DIAGNOSIS — J320 Chronic maxillary sinusitis: Secondary | ICD-10-CM | POA: Diagnosis not present

## 2020-02-23 DIAGNOSIS — H1033 Unspecified acute conjunctivitis, bilateral: Secondary | ICD-10-CM | POA: Diagnosis not present

## 2020-02-23 DIAGNOSIS — B9689 Other specified bacterial agents as the cause of diseases classified elsewhere: Secondary | ICD-10-CM | POA: Diagnosis not present

## 2020-02-23 DIAGNOSIS — Z20822 Contact with and (suspected) exposure to covid-19: Secondary | ICD-10-CM | POA: Diagnosis not present

## 2020-02-23 DIAGNOSIS — J019 Acute sinusitis, unspecified: Secondary | ICD-10-CM | POA: Diagnosis not present

## 2020-02-23 DIAGNOSIS — H53141 Visual discomfort, right eye: Secondary | ICD-10-CM | POA: Diagnosis not present

## 2020-02-23 LAB — CBC WITH DIFFERENTIAL/PLATELET
Abs Immature Granulocytes: 0.17 10*3/uL — ABNORMAL HIGH (ref 0.00–0.07)
Basophils Absolute: 0 10*3/uL (ref 0.0–0.1)
Basophils Relative: 0 %
Eosinophils Absolute: 0.2 10*3/uL (ref 0.0–0.5)
Eosinophils Relative: 2 %
HCT: 33 % — ABNORMAL LOW (ref 36.0–46.0)
Hemoglobin: 10.4 g/dL — ABNORMAL LOW (ref 12.0–15.0)
Immature Granulocytes: 1 %
Lymphocytes Relative: 15 %
Lymphs Abs: 1.8 10*3/uL (ref 0.7–4.0)
MCH: 29.5 pg (ref 26.0–34.0)
MCHC: 31.5 g/dL (ref 30.0–36.0)
MCV: 93.8 fL (ref 80.0–100.0)
Monocytes Absolute: 0.9 10*3/uL (ref 0.1–1.0)
Monocytes Relative: 8 %
Neutro Abs: 9.3 10*3/uL — ABNORMAL HIGH (ref 1.7–7.7)
Neutrophils Relative %: 74 %
Platelets: 320 10*3/uL (ref 150–400)
RBC: 3.52 MIL/uL — ABNORMAL LOW (ref 3.87–5.11)
RDW: 14 % (ref 11.5–15.5)
WBC: 12.5 10*3/uL — ABNORMAL HIGH (ref 4.0–10.5)
nRBC: 0 % (ref 0.0–0.2)

## 2020-02-23 LAB — BASIC METABOLIC PANEL
Anion gap: 13 (ref 5–15)
BUN: 6 mg/dL (ref 6–20)
CO2: 21 mmol/L — ABNORMAL LOW (ref 22–32)
Calcium: 9.8 mg/dL (ref 8.9–10.3)
Chloride: 101 mmol/L (ref 98–111)
Creatinine, Ser: 0.53 mg/dL (ref 0.44–1.00)
GFR calc Af Amer: >60 mL/min (ref >60)
GFR calc non Af Amer: >60 mL/min (ref >60)
Glucose, Bld: 83 mg/dL (ref 70–99)
Potassium: 4.7 mmol/L (ref 3.5–5.1)
Sodium: 135 mmol/L (ref 135–145)

## 2020-02-23 MED ORDER — AZITHROMYCIN 1 % OP SOLN: 1.0000 [drp] | Freq: Two times a day (BID) | OPHTHALMIC | 0 refills | Status: AC

## 2020-02-23 MED ORDER — IOHEXOL 300 MG/ML  SOLN
75.0000 mL | Freq: Once | INTRAMUSCULAR | Status: AC | PRN
  Administered 2020-02-23: 75 mL via INTRAVENOUS

## 2020-02-23 MED ORDER — ERYTHROMYCIN 5 MG/GM OP OINT
1.0000 "application " | TOPICAL_OINTMENT | Freq: Once | OPHTHALMIC | Status: AC
  Administered 2020-02-23: 1 via OPHTHALMIC
  Filled 2020-02-23: qty 3.5

## 2020-02-23 MED ORDER — AMOXICILLIN-POT CLAVULANATE 875-125 MG PO TABS
1.0000 | ORAL_TABLET | Freq: Two times a day (BID) | ORAL | 0 refills | Status: AC
Start: 2020-02-23 — End: 2020-02-28

## 2020-02-23 NOTE — ED Notes (Signed)
Discharge instructions reviewed with pt. Pt verbalized understanding.   

## 2020-02-23 NOTE — ED Provider Notes (Signed)
[facility] EMERGENCY DEPARTMENT Provider Note   CSN: [id number] Arrival date & time: 02/23/20  1519     History Chief Complaint  Patient presents with  . Facial Pain  . Eye Drainage  . Cough    Marchella Hibbard is a 29 y.o. female with a past medical history significant for anxiety, stress-induced asthma, and IBS who presents to the ED from urgent care to rule out orbital cellulitis.  Patient admits to right eye redness and sinus pressure x5 days.  Patient is currently [redacted] weeks pregnant.  Patient has been taking azithromycin with no relief.  She is on day 5 of 6.  Patient admits to associated cough.  She also notes that her right eye is glued shut in the morning.  Denies visual changes, fever, and chills.  Patient was evaluated in urgent care prior to arrival and sent to the ED due to pain with EOMs to rule out orbital cellulitis. She admits to mild erythema and edema around right eye associated with photophobia. She notes her left eye started showing similar symptoms this morning. Admits to a dry cough. Denies sick contacts and COVID expsoures. Patient had a negative rapid COVID test at [facility] prior to arrival. She admits to chronic shortness of breath due to pregnancy, but no change since this illness. Denies chest pain.   History obtained from patient and past medical records. No interpreter used during encounter.      Past Medical History:  Diagnosis Date  . Anxiety   . Asthma    stress induced  . IBS (irritable bowel syndrome)     Patient Active Problem List   Diagnosis Date Noted  . Pregnancy 08/02/2017  . Normal labor and delivery 08/01/2017    Past Surgical History:  Procedure Laterality Date  . COLONOSCOPY    . SINUS SURGERY WITH INSTATRAK    . WISDOM TOOTH EXTRACTION       OB History    Gravida  2   Para  1   Term  1   Preterm      AB      Living  1     SAB      TAB      Ectopic      Multiple  0   Live Births  1            Family History  Problem Relation Age of Onset  . Diabetes Maternal Grandfather     Social History   Tobacco Use  . Smoking status: Never Smoker  . Smokeless tobacco: Never Used  Substance Use Topics  . Alcohol use: No  . Drug use: No    Home Medications Prior to Admission medications   Medication Sig Start Date End Date Taking? Authorizing Provider  loratadine (CLARITIN) 10 MG tablet Take 10 mg by mouth daily.   Yes [provider]  MAGNESIUM PO Take 1 tablet by mouth at bedtime.   Yes [provider]  Prenatal Vit-Fe Fumarate-FA (PRENATAL MULTIVITAMIN) TABS tablet Take 1 tablet by mouth at bedtime.   Yes [provider]  sertraline (ZOLOFT) 100 MG tablet Take 100 mg by mouth at bedtime. 05/31/17  Yes [provider]  amoxicillin-clavulanate (AUGMENTIN) 875-125 MG tablet Take 1 tablet by mouth every 12 (twelve) hours for 5 days. 02/23/20 02/28/20  Suzy Bouchard, PA-C  azithromycin (AZASITE) 1 % ophthalmic solution Place 1 drop into both eyes 2 (two) times daily for 5 days. 02/23/20 02/28/20  Mannie Stabile, PA-C  ibuprofen (ADVIL,MOTRIN) 800 MG tablet Take 1 tablet (800 mg total) by mouth every 8 (eight) hours as needed for moderate pain. Patient not taking: Reported on 02/23/2020 08/03/17   Candice Camp, MD  oxyCODONE-acetaminophen (PERCOCET/ROXICET) 5-325 MG tablet Take 1 tablet by mouth every 4 (four) hours as needed (pain scale 4-7). Patient not taking: Reported on 02/23/2020 08/03/17   Candice Camp, MD    Allergies    Other and Penicillins  Review of Systems   Review of Systems  Constitutional: Negative for chills and fever.  HENT: Positive for sinus pressure, sinus pain and sore throat. Negative for trouble swallowing and voice change.   Eyes: Positive for photophobia, pain, discharge and redness. Negative for visual disturbance.  Respiratory: Positive for shortness of breath (chronic due to pregnancy).   Cardiovascular: Negative for  chest pain.  Gastrointestinal: Negative for abdominal pain, diarrhea, nausea and vomiting.  All other systems reviewed and are negative.   Physical Exam Updated Vital Signs BP 124/89 (BP Location: Left Arm)   Pulse (!) 102   Temp 98.5 F (36.9 C) (Oral)   Resp 16   Ht 5\' 4"  (1.626 m)   Wt 72.6 kg   SpO2 100%   BMI 27.46 kg/m   Physical Exam Vitals and nursing note reviewed.  Constitutional:      General: She is not in acute distress. HENT:     Head: Normocephalic.     Mouth/Throat:     Comments: Posterior oropharynx clear and mucous membranes moist, there is mild erythema but no edema or tonsillar exudates, uvula midline, normal phonation, no trismus, tolerating secretions without difficulty. Eyes:     General:        Right eye: Discharge present.        Left eye: Discharge present.    Pupils: Pupils are equal, round, and reactive to light.     Comments: Bilateral green discharge. Right injected conjunctiva. EOMS intact with mild pain. PERRL. Tenderness to palpation over right maxillary sinus.   Cardiovascular:     Rate and Rhythm: Normal rate and regular rhythm.     Pulses: Normal pulses.     Heart sounds: Normal heart sounds. No murmur. No friction rub. No gallop.   Pulmonary:     Effort: Pulmonary effort is normal.     Breath sounds: Normal breath sounds.     Comments: Respirations equal and unlabored, patient able to speak in full sentences, lungs clear to auscultation bilaterally Abdominal:     General: Abdomen is flat. There is no distension.     Palpations: Abdomen is soft.     Tenderness: There is no abdominal tenderness. There is no guarding or rebound.     Comments: [redacted] weeks pregnant.   Musculoskeletal:     Cervical back: Neck supple.     Comments: Able to move all 4 extremities without difficulty.   Skin:    General: Skin is warm and dry.  Neurological:     General: No focal deficit present.  Psychiatric:        Mood and Affect: Mood normal.         Behavior: Behavior normal.     ED Results / Procedures / Treatments   Labs (all labs ordered are listed, but only abnormal results are displayed) Labs Reviewed  CBC WITH DIFFERENTIAL/PLATELET - Abnormal; Notable for the following components:      Result Value   WBC 12.5 (*)    RBC 3.52 (*)  Hemoglobin 10.4 (*)    HCT 33.0 (*)    Neutro Abs 9.3 (*)    Abs Immature Granulocytes 0.17 (*)    All other components within normal limits  BASIC METABOLIC PANEL - Abnormal; Notable for the following components:   CO2 21 (*)    All other components within normal limits    EKG None  Radiology No results found.  Procedures Procedures (including critical care time)  Medications Ordered in ED Medications  erythromycin ophthalmic ointment 1 application (has no administration in time range)  iohexol (OMNIPAQUE) 300 MG/ML solution 75 mL (75 mLs Intravenous Contrast Given 02/23/20 2209)    ED Course  I have reviewed the triage vital signs and the nursing notes.  Pertinent labs & imaging results that were available during my care of the patient were reviewed by me and considered in my medical decision making (see chart for details).  Clinical Course as of Feb 22 2218  Mon Feb 23, 2020  2136 Spoke to Vernona Rieger with pharmacy who notes Augmentin is okay in pregnancy. Patient notes she took Amoxicillin last year with no reaction.    [CA]    Clinical Course User Index [CA] Jesusita Oka   MDM Rules/Calculators/A&P                     29 year old female presents to the ED from urgent care to rule out orbital cellulitis.  Patient admits to erythema, edema, and green drainage from right thigh for the past 5 days.  Denies fever and chills.  Vitals all within normal limits.  Patient is afebrile, not tachycardic or hypoxic.  Per triage note, patient tachycardic at 102 however during my initial exam, patient's heart rate in the 90s.  Physical exam significant for mild right injected  conjunctiva with green drainage bilaterally.  EOMs intact with mild pain.  Tenderness palpation over right maxillary sinus.  Will obtain routine labs and CT orbits to rule out orbital cellulitis.  No concern for sepsis at this time.  CBC significant for mild leukocytosis at 12.5, but otherwise reassuring.  BMP reassuring with normal renal function and no major electrolyte derangements.  Patient handed off to [facility], PA-C at shift change pending CT orbits. If CT negative for orbital cellulitis, will expand abx to oral Augmentin and Azithromycin eye drops.   Discussed case with Dr. Lynelle Doctor who agrees with assessment and plan.  Final Clinical Impression(s) / ED Diagnoses Final diagnoses:  Acute non-recurrent maxillary sinusitis  Acute bacterial conjunctivitis of both eyes    Rx / DC Orders ED Discharge Orders         Ordered    amoxicillin-clavulanate (AUGMENTIN) 875-125 MG tablet  Every 12 hours     02/23/20 2136    azithromycin (AZASITE) 1 % ophthalmic solution  2 times daily     02/23/20 2153           Jesusita Oka 02/23/20 2219    Linwood Dibbles, MD 02/24/20 [phone]

## 2020-02-23 NOTE — ED Triage Notes (Signed)
Pt reports sinus infection/pain, right eye redness and drainage as well as a cough. Pt seen by UC and sent here for further evaluation to rule out orbital cellulitis. Pt is [redacted] weeks pregnant.

## 2020-02-23 NOTE — ED Notes (Signed)
CT called to notify that pt is ready for scan.

## 2020-02-23 NOTE — Discharge Instructions (Addendum)
As discussed, I am going to change your antibiotics to Augmentin. Take as prescribed and finish all antibiotics. I am also sending you home with eye drops. Please call your OBGYN tomorrow to ensure medications are safe in pregnancy. Return to the ER for new or worsening symptoms.   CT report was provided to you at discharge.

## 2020-02-23 NOTE — ED Notes (Signed)
Pt transported to CT ?

## 2020-02-23 NOTE — ED Provider Notes (Signed)
  Physical Exam  BP 124/89 (BP Location: Left Arm)   Pulse (!) 102   Temp 98.5 F (36.9 C) (Oral)   Resp 16   Ht 5\' 4"  (1.626 m)   Wt 72.6 kg   SpO2 100%   BMI 27.46 kg/m   Physical Exam  ED Course/Procedures   Clinical Course as of Feb 22 2233  Mon Feb 23, 2020  2136 Spoke to 2137 with pharmacy who notes Augmentin is okay in pregnancy. Patient notes she took Amoxicillin last year with no reaction.    [CA]    Clinical Course User Index [CA] Vernona Rieger, PA-C    Procedures  MDM  Patient care assumed from [place] A. PA at shift change,  HPI.  Briefly, patient here with redness along with sinus pressure for the past 5 days.  Patient has been given Bactrim reports concerns.  She is sent here from urgent care is currently pending CT orbital for to rule out oral cellulitis.  10:39 PM CT orbits with contrast showed:  1. Right maxillary sinusitis with evidence of Rhinitis and mild left  maxillary sinus inflammation. But no complicating features  identified.  2. Symmetric and negative CT appearance of the Orbits.        Copy of CT results have been provided for patient, she is aware that she will need to follow-up with her OB/GYN as needed.  For antibiotics, she is receiving steroid treatment while in the ED.  Discussed discharge plan with patient, she is agreeable of this.  Return precautions discussed at length.  Patient is stable for discharge.    Portions of this note were generated with [place]. Dictation errors may occur despite best attempts at proofreading.        Scientist, clinical (histocompatibility and immunogenetics), PA-C 02/23/20 2244    02/25/20, MD 02/24/20 [phone]

## 2020-03-11 DIAGNOSIS — D509 Iron deficiency anemia, unspecified: Secondary | ICD-10-CM | POA: Diagnosis not present

## 2020-03-11 DIAGNOSIS — K9049 Malabsorption due to intolerance, not elsewhere classified: Secondary | ICD-10-CM | POA: Diagnosis not present

## 2020-03-11 DIAGNOSIS — O99019 Anemia complicating pregnancy, unspecified trimester: Secondary | ICD-10-CM | POA: Diagnosis not present

## 2020-03-16 DIAGNOSIS — Z3685 Encounter for antenatal screening for Streptococcus B: Secondary | ICD-10-CM | POA: Diagnosis not present

## 2020-03-16 LAB — OB RESULTS CONSOLE GBS: GBS: NEGATIVE

## 2020-03-18 DIAGNOSIS — K9049 Malabsorption due to intolerance, not elsewhere classified: Secondary | ICD-10-CM | POA: Diagnosis not present

## 2020-03-18 DIAGNOSIS — D509 Iron deficiency anemia, unspecified: Secondary | ICD-10-CM | POA: Diagnosis not present

## 2020-03-18 DIAGNOSIS — O99019 Anemia complicating pregnancy, unspecified trimester: Secondary | ICD-10-CM | POA: Diagnosis not present

## 2020-04-01 DIAGNOSIS — Z3400 Encounter for supervision of normal first pregnancy, unspecified trimester: Secondary | ICD-10-CM | POA: Diagnosis not present

## 2020-04-07 ENCOUNTER — Inpatient Hospital Stay (HOSPITAL_COMMUNITY)
Admission: AD | Admit: 2020-04-07 | Discharge: 2020-04-09 | DRG: 768 | Disposition: A | Discharge status: Home / Self Care | Payer: BC Managed Care – PPO | Attending: Obstetrics and Gynecology | Admitting: Obstetrics and Gynecology

## 2020-04-07 ENCOUNTER — Encounter (HOSPITAL_COMMUNITY): Payer: Self-pay | Admitting: General Practice

## 2020-04-07 ENCOUNTER — Inpatient Hospital Stay (HOSPITAL_COMMUNITY): Payer: BC Managed Care – PPO | Admitting: Anesthesiology

## 2020-04-07 DIAGNOSIS — O411230 Chorioamnionitis, third trimester, not applicable or unspecified: Secondary | ICD-10-CM | POA: Diagnosis present

## 2020-04-07 DIAGNOSIS — Z880 Allergy status to penicillin: Secondary | ICD-10-CM

## 2020-04-07 DIAGNOSIS — O4292 Full-term premature rupture of membranes, unspecified as to length of time between rupture and onset of labor: Principal | ICD-10-CM | POA: Diagnosis present

## 2020-04-07 DIAGNOSIS — Z20822 Contact with and (suspected) exposure to covid-19: Secondary | ICD-10-CM | POA: Diagnosis not present

## 2020-04-07 DIAGNOSIS — O8612 Endometritis following delivery: Secondary | ICD-10-CM | POA: Diagnosis not present

## 2020-04-07 DIAGNOSIS — Z3A38 38 weeks gestation of pregnancy: Secondary | ICD-10-CM | POA: Diagnosis not present

## 2020-04-07 DIAGNOSIS — N898 Other specified noninflammatory disorders of vagina: Secondary | ICD-10-CM | POA: Diagnosis present

## 2020-04-07 DIAGNOSIS — Z3490 Encounter for supervision of normal pregnancy, unspecified, unspecified trimester: Secondary | ICD-10-CM

## 2020-04-07 DIAGNOSIS — Z3A00 Weeks of gestation of pregnancy not specified: Secondary | ICD-10-CM | POA: Diagnosis not present

## 2020-04-07 DIAGNOSIS — J45909 Unspecified asthma, uncomplicated: Secondary | ICD-10-CM | POA: Diagnosis not present

## 2020-04-07 DIAGNOSIS — O9952 Diseases of the respiratory system complicating childbirth: Secondary | ICD-10-CM | POA: Diagnosis not present

## 2020-04-07 DIAGNOSIS — O43819 Placental infarction, unspecified trimester: Secondary | ICD-10-CM | POA: Diagnosis not present

## 2020-04-07 LAB — CBC
HCT: 38 % (ref 36.0–46.0)
HCT: 35.6 % — ABNORMAL LOW (ref 36.0–46.0)
Hemoglobin: 12 g/dL (ref 12.0–15.0)
Hemoglobin: 11.3 g/dL — ABNORMAL LOW (ref 12.0–15.0)
MCH: 30.7 pg (ref 26.0–34.0)
MCH: 30.9 pg (ref 26.0–34.0)
MCHC: 31.6 g/dL (ref 30.0–36.0)
MCHC: 31.7 g/dL (ref 30.0–36.0)
MCV: 97.2 fL (ref 80.0–100.0)
MCV: 97.3 fL (ref 80.0–100.0)
Platelets: 208 10*3/uL (ref 150–400)
Platelets: 230 10*3/uL (ref 150–400)
RBC: 3.91 MIL/uL (ref 3.87–5.11)
RBC: 3.66 MIL/uL — ABNORMAL LOW (ref 3.87–5.11)
RDW: 20.4 % — ABNORMAL HIGH (ref 11.5–15.5)
RDW: 20.1 % — ABNORMAL HIGH (ref 11.5–15.5)
WBC: 8.3 10*3/uL (ref 4.0–10.5)
WBC: 15.6 10*3/uL — ABNORMAL HIGH (ref 4.0–10.5)
nRBC: 0 % (ref 0.0–0.2)
nRBC: 0 % (ref 0.0–0.2)

## 2020-04-07 LAB — TYPE AND SCREEN
ABO/RH(D): A POS
Antibody Screen: NEGATIVE

## 2020-04-07 LAB — ABO/RH: ABO/RH(D): A POS

## 2020-04-07 LAB — SARS CORONAVIRUS 2 BY RT PCR (HOSPITAL ORDER, PERFORMED IN ~~LOC~~ HOSPITAL LAB): SARS Coronavirus 2: NEGATIVE

## 2020-04-07 MED ORDER — OXYTOCIN-SODIUM CHLORIDE 30-0.9 UT/500ML-% IV SOLN
1.0000 m[IU]/min | INTRAVENOUS | Status: DC
  Administered 2020-04-07: 2 m[IU]/min via INTRAVENOUS
  Filled 2020-04-07: qty 500

## 2020-04-07 MED ORDER — PHENYLEPHRINE 40 MCG/ML (10ML) SYRINGE FOR IV PUSH (FOR BLOOD PRESSURE SUPPORT): 80.0000 ug | PREFILLED_SYRINGE | INTRAVENOUS | Status: DC | PRN

## 2020-04-07 MED ORDER — DIPHENHYDRAMINE HCL 50 MG/ML IJ SOLN: 12.5000 mg | INTRAMUSCULAR | Status: DC | PRN

## 2020-04-07 MED ORDER — LACTATED RINGERS IV SOLN: 500.0000 mL | Freq: Once | INTRAVENOUS | Status: DC

## 2020-04-07 MED ORDER — ACETAMINOPHEN 325 MG PO TABS: 650.0000 mg | ORAL_TABLET | ORAL | Status: DC | PRN

## 2020-04-07 MED ORDER — OXYCODONE-ACETAMINOPHEN 5-325 MG PO TABS: 2.0000 | ORAL_TABLET | ORAL | Status: DC | PRN

## 2020-04-07 MED ORDER — OXYTOCIN-SODIUM CHLORIDE 30-0.9 UT/500ML-% IV SOLN: 2.5000 [IU]/h | INTRAVENOUS | Status: DC

## 2020-04-07 MED ORDER — FENTANYL-BUPIVACAINE-NACL 0.5-0.125-0.9 MG/250ML-% EP SOLN
12.0000 mL/h | EPIDURAL | Status: DC | PRN
  Filled 2020-04-07: qty 250

## 2020-04-07 MED ORDER — MISOPROSTOL 200 MCG PO TABS
ORAL_TABLET | ORAL | Status: AC
  Administered 2020-04-07: 1000 ug via RECTAL
  Filled 2020-04-07: qty 5

## 2020-04-07 MED ORDER — LACTATED RINGERS IV SOLN: INTRAVENOUS | Status: DC

## 2020-04-07 MED ORDER — GENTAMICIN SULFATE 40 MG/ML IJ SOLN
5.0000 mg/kg | INTRAVENOUS | Status: DC
  Administered 2020-04-08: 310 mg via INTRAVENOUS
  Filled 2020-04-07: qty 7.75

## 2020-04-07 MED ORDER — LIDOCAINE HCL (PF) 1 % IJ SOLN: 30.0000 mL | INTRAMUSCULAR | Status: DC | PRN

## 2020-04-07 MED ORDER — MISOPROSTOL 200 MCG PO TABS: 1000.0000 ug | ORAL_TABLET | Freq: Once | ORAL | Status: AC

## 2020-04-07 MED ORDER — LACTATED RINGERS IV SOLN: 500.0000 mL | INTRAVENOUS | Status: DC | PRN

## 2020-04-07 MED ORDER — BUTORPHANOL TARTRATE 1 MG/ML IJ SOLN: 1.0000 mg | INTRAMUSCULAR | Status: DC | PRN

## 2020-04-07 MED ORDER — TERBUTALINE SULFATE 1 MG/ML IJ SOLN: 0.2500 mg | Freq: Once | INTRAMUSCULAR | Status: DC | PRN

## 2020-04-07 MED ORDER — SODIUM CHLORIDE (PF) 0.9 % IJ SOLN
INTRAMUSCULAR | Status: DC | PRN
  Administered 2020-04-07: 12 mL/h via EPIDURAL

## 2020-04-07 MED ORDER — SOD CITRATE-CITRIC ACID 500-334 MG/5ML PO SOLN: 30.0000 mL | ORAL | Status: DC | PRN

## 2020-04-07 MED ORDER — LIDOCAINE HCL (PF) 1 % IJ SOLN
INTRAMUSCULAR | Status: DC | PRN
  Administered 2020-04-07: 2 mL via EPIDURAL
  Administered 2020-04-07: 10 mL via EPIDURAL

## 2020-04-07 MED ORDER — OXYCODONE-ACETAMINOPHEN 5-325 MG PO TABS: 1.0000 | ORAL_TABLET | ORAL | Status: DC | PRN

## 2020-04-07 MED ORDER — EPHEDRINE 5 MG/ML INJ: 10.0000 mg | INTRAVENOUS | Status: DC | PRN

## 2020-04-07 MED ORDER — CLINDAMYCIN PHOSPHATE 900 MG/50ML IV SOLN
900.0000 mg | Freq: Three times a day (TID) | INTRAVENOUS | Status: AC
  Administered 2020-04-07 – 2020-04-08 (×3): 900 mg via INTRAVENOUS
  Filled 2020-04-07 (×3): qty 50

## 2020-04-07 MED ORDER — ACETAMINOPHEN 500 MG PO TABS
1000.0000 mg | ORAL_TABLET | Freq: Once | ORAL | Status: AC
  Administered 2020-04-07: 1000 mg via ORAL
  Filled 2020-04-07: qty 2

## 2020-04-07 MED ORDER — ONDANSETRON HCL 4 MG/2ML IJ SOLN: 4.0000 mg | Freq: Four times a day (QID) | INTRAMUSCULAR | Status: DC | PRN

## 2020-04-07 MED ORDER — HYDROXYZINE HCL 50 MG PO TABS: 50.0000 mg | ORAL_TABLET | Freq: Four times a day (QID) | ORAL | Status: DC | PRN

## 2020-04-07 MED ORDER — OXYTOCIN BOLUS FROM INFUSION
333.0000 mL | Freq: Once | INTRAVENOUS | Status: AC
  Administered 2020-04-07: 333 mL via INTRAVENOUS

## 2020-04-07 NOTE — Progress Notes (Signed)
Tylenol given for fever Abx ordered Patient vomting and BP at that time 170s/100s, thus likely inaccurate.  BP cuff on leg reading 150s/90s PIH labs sent No persistent severe range Bps No s/s severe HTNive dz CTM closely Anethesia excluded malignant hyperthermia as she received none of the typical offending medications.

## 2020-04-07 NOTE — Anesthesia Preprocedure Evaluation (Signed)
Anesthesia Evaluation  Patient identified by MRN, date of birth, ID band Patient awake    Reviewed: Allergy & Precautions, Patient's Chart, lab work & pertinent test results  Airway Mallampati: II  TM Distance: >3 FB Neck ROM: Full    Dental no notable dental hx.    Pulmonary asthma ,    Pulmonary exam normal breath sounds clear to auscultation       Cardiovascular negative cardio ROS Normal cardiovascular exam Rhythm:Regular Rate:Normal     Neuro/Psych PSYCHIATRIC DISORDERS Anxiety negative neurological ROS     GI/Hepatic Neg liver ROS, IBS   Endo/Other  negative endocrine ROS  Renal/GU negative Renal ROS  negative genitourinary   Musculoskeletal negative musculoskeletal ROS (+)   Abdominal   Peds  Hematology negative hematology ROS (+) hct 38, plt 208   Anesthesia Other Findings   Reproductive/Obstetrics (+) Pregnancy                             Anesthesia Physical Anesthesia Plan  ASA: II and emergent  Anesthesia Plan: Epidural   Post-op Pain Management:    Induction:   PONV Risk Score and Plan: 2  Airway Management Planned: Natural Airway  Additional Equipment: None  Intra-op Plan:   Post-operative Plan:   Informed Consent: I have reviewed the patients History and Physical, chart, labs and discussed the procedure including the risks, benefits and alternatives for the proposed anesthesia with the patient or authorized representative who has indicated his/her understanding and acceptance.       Plan Discussed with:   Anesthesia Plan Comments:         Anesthesia Quick Evaluation

## 2020-04-07 NOTE — Anesthesia Procedure Notes (Signed)
Epidural Patient location during procedure: OB Start time: 04/07/2020 4:57 PM End time: 04/07/2020 5:06 PM  Staffing Anesthesiologist: Lannie Fields, DO Performed: anesthesiologist   Preanesthetic Checklist Completed: patient identified, IV checked, risks and benefits discussed, monitors and equipment checked, pre-op evaluation and timeout performed  Epidural Patient position: sitting Prep: DuraPrep and site prepped and draped Patient monitoring: continuous pulse ox, blood pressure, heart rate and cardiac monitor Approach: midline Location: L3-L4 Injection technique: LOR air  Needle:  Needle type: Tuohy  Needle gauge: 17 G Needle length: 9 cm Needle insertion depth: 5 cm Catheter type: closed end flexible Catheter size: 19 Gauge Catheter at skin depth: 10 cm Test dose: negative  Assessment Sensory level: T8 Events: blood not aspirated, injection not painful, no injection resistance, no paresthesia and negative IV test  Additional Notes Patient identified. Risks/Benefits/Options discussed with patient including but not limited to bleeding, infection, nerve damage, paralysis, failed block, incomplete pain control, headache, blood pressure changes, nausea, vomiting, reactions to medication both or allergic, itching and postpartum back pain. Confirmed with bedside nurse the patient's most recent platelet count. Confirmed with patient that they are not currently taking any anticoagulation, have any bleeding history or any family history of bleeding disorders. Patient expressed understanding and wished to proceed. All questions were answered. Sterile technique was used throughout the entire procedure. Please see nursing notes for vital signs. Test dose was given through epidural catheter and negative prior to continuing to dose epidural or start infusion. Warning signs of high block given to the patient including shortness of breath, tingling/numbness in hands, complete motor  block, or any concerning symptoms with instructions to call for help. Patient was given instructions on fall risk and not to get out of bed. All questions and concerns addressed with instructions to call with any issues or inadequate analgesia.  Reason for block:procedure for pain

## 2020-04-07 NOTE — H&P (Signed)
Michaela Sharp is a 29 y.o. female presenting for PROM confirmed int he office. Pregnancy c/b placenta with possible succenturiate lobe and LLP that resolved. RR panorama.Hx SVD.   OB History    Gravida  2   Para  1   Term  1   Preterm      AB      Living  1     SAB      TAB      Ectopic      Multiple  0   Live Births  1          Past Medical History:  Diagnosis Date  . Anxiety   . Asthma    stress induced  . IBS (irritable bowel syndrome)    Past Surgical History:  Procedure Laterality Date  . COLONOSCOPY    . SINUS SURGERY WITH INSTATRAK    . WISDOM TOOTH EXTRACTION     Family History: family history includes Diabetes in her maternal grandfather. Social History:  reports that she has never smoked. She has never used smokeless tobacco. She reports that she does not drink alcohol and does not use drugs.     Maternal Diabetes: No Genetic Screening: Normal Maternal Ultrasounds/Referrals: Normal Fetal Ultrasounds or other Referrals:  Other:  Succinturiate lobe placenta Maternal Substance Abuse:  No Significant Maternal Medications:  None Significant Maternal Lab Results:  None Other Comments:  None  Review of Systems History   unknown if currently breastfeeding. Exam Physical Exam  (from office) NAD, A&O NWOB Abd soft, nondistended, gravid Grossly ruptured Prenatal labs: ABO, Rh:   Antibody:   Rubella:   RPR:    HBsAg:    HIV:    GBS:   negative  Assessment/Plan: 29 yo G2P1001 presenting at 38.2 wga s/p PROM.  Pitocin. GBS negative.     Ranae Pila 04/07/2020, 3:18 PM

## 2020-04-07 NOTE — Progress Notes (Signed)
Pharmacy Antibiotic Note  Michaela Sharp is a 29 y.o. female admitted on 04/07/2020 with PROM.  Post delivery concern for infection.   Pharmacy has been consulted for gentamicin dosing.  Plan: gentamicin 5 mg/kg adjusted BW Q24 hours  Height: 5\' 4"  (162.6 cm) Weight: 74.8 kg (165 lb) IBW/kg (Calculated) : 54.7  Temp (24hrs), Avg:99.3 F (37.4 C), Min:98.2 F (36.8 C), Max:102.4 F (39.1 C)  Recent Labs  Lab 04/07/20 1559  WBC 8.3    CrCl cannot be calculated (Patient's most recent lab result is older than the maximum 21 days allowed.).    Allergies  Allergen Reactions   Other     Chromic sutures- states body pushes them out   Penicillins Hives and Itching    Has patient had a PCN reaction causing immediate rash, facial/tongue/throat swelling, SOB or lightheadedness with hypotension: Yes Has patient had a PCN reaction causing severe rash involving mucus membranes or skin necrosis: No Has patient had a PCN reaction that required hospitalization: No Has patient had a PCN reaction occurring within the last 10 years: No If all of the above answers are "NO", then may proceed with Cephalosporin use.    Antimicrobials this admission: Clindamycin 900mg  IV Q8 hours x 3 doses 6/30 >>  Gentamicin 5mg /kg Q24 6/30 >>    Thank you for allowing pharmacy to be a part of this patients care.  7/30 04/07/2020 11:31 PM

## 2020-04-07 NOTE — Progress Notes (Signed)
Active labor SVE unchanged from RN previous exam > 1 hour ago IUPC placed Continue pitocin

## 2020-04-07 NOTE — Progress Notes (Signed)
Called s/s new onset patient fever and rigors. Fever 102.9. Shaking and unable to get good BP reading s/s shaking.  Did have cytotec which may contribute to new onset of fever.  Will start gent/clinda for presume chorio.  Anethesia to evaluate to exclude malignant hyperthermia. Did not have issues with last CLE and medications used during last delivery.

## 2020-04-08 LAB — CBC
HCT: 31.4 % — ABNORMAL LOW (ref 36.0–46.0)
Hemoglobin: 10.3 g/dL — ABNORMAL LOW (ref 12.0–15.0)
MCH: 31.6 pg (ref 26.0–34.0)
MCHC: 32.8 g/dL (ref 30.0–36.0)
MCV: 96.3 fL (ref 80.0–100.0)
Platelets: 189 10*3/uL (ref 150–400)
RBC: 3.26 MIL/uL — ABNORMAL LOW (ref 3.87–5.11)
RDW: 19.9 % — ABNORMAL HIGH (ref 11.5–15.5)
WBC: 13.6 10*3/uL — ABNORMAL HIGH (ref 4.0–10.5)
nRBC: 0 % (ref 0.0–0.2)

## 2020-04-08 LAB — COMPREHENSIVE METABOLIC PANEL
ALT: 18 U/L (ref 0–44)
AST: 34 U/L (ref 15–41)
Albumin: 2.2 g/dL — ABNORMAL LOW (ref 3.5–5.0)
Alkaline Phosphatase: 186 U/L — ABNORMAL HIGH (ref 38–126)
Anion gap: 10 (ref 5–15)
BUN: 8 mg/dL (ref 6–20)
CO2: 21 mmol/L — ABNORMAL LOW (ref 22–32)
Calcium: 8.6 mg/dL — ABNORMAL LOW (ref 8.9–10.3)
Chloride: 102 mmol/L (ref 98–111)
Creatinine, Ser: 0.67 mg/dL (ref 0.44–1.00)
GFR calc Af Amer: >60 mL/min (ref >60)
GFR calc non Af Amer: >60 mL/min (ref >60)
Glucose, Bld: 101 mg/dL — ABNORMAL HIGH (ref 70–99)
Potassium: 4.5 mmol/L (ref 3.5–5.1)
Sodium: 133 mmol/L — ABNORMAL LOW (ref 135–145)
Total Bilirubin: 0.5 mg/dL (ref 0.3–1.2)
Total Protein: 5.2 g/dL — ABNORMAL LOW (ref 6.5–8.1)

## 2020-04-08 LAB — RPR: RPR Ser Ql: NONREACTIVE

## 2020-04-08 MED ORDER — ONDANSETRON HCL 4 MG/2ML IJ SOLN: 4.0000 mg | INTRAMUSCULAR | Status: DC | PRN

## 2020-04-08 MED ORDER — DIPHENHYDRAMINE HCL 25 MG PO CAPS: 25.0000 mg | ORAL_CAPSULE | Freq: Four times a day (QID) | ORAL | Status: DC | PRN

## 2020-04-08 MED ORDER — BUPROPION HCL ER (XL) 150 MG PO TB24
150.0000 mg | ORAL_TABLET | Freq: Every morning | ORAL | Status: DC
  Administered 2020-04-08 – 2020-04-09 (×2): 150 mg via ORAL
  Filled 2020-04-08 (×2): qty 1

## 2020-04-08 MED ORDER — ACETAMINOPHEN 325 MG PO TABS: 650.0000 mg | ORAL_TABLET | ORAL | Status: DC | PRN

## 2020-04-08 MED ORDER — COCONUT OIL OIL: 1.0000 "application " | TOPICAL_OIL | Status: DC | PRN

## 2020-04-08 MED ORDER — ONDANSETRON HCL 4 MG PO TABS: 4.0000 mg | ORAL_TABLET | ORAL | Status: DC | PRN

## 2020-04-08 MED ORDER — OXYCODONE HCL 5 MG PO TABS: 10.0000 mg | ORAL_TABLET | ORAL | Status: DC | PRN

## 2020-04-08 MED ORDER — SERTRALINE HCL 100 MG PO TABS: 100.0000 mg | ORAL_TABLET | Freq: Every day | ORAL | Status: DC

## 2020-04-08 MED ORDER — TETANUS-DIPHTH-ACELL PERTUSSIS 5-2.5-18.5 LF-MCG/0.5 IM SUSP: 0.5000 mL | Freq: Once | INTRAMUSCULAR | Status: DC

## 2020-04-08 MED ORDER — BENZOCAINE-MENTHOL 20-0.5 % EX AERO
1.0000 "application " | INHALATION_SPRAY | CUTANEOUS | Status: DC | PRN
  Administered 2020-04-08: 1 via TOPICAL
  Filled 2020-04-08: qty 56

## 2020-04-08 MED ORDER — IBUPROFEN 600 MG PO TABS
600.0000 mg | ORAL_TABLET | Freq: Four times a day (QID) | ORAL | Status: DC
  Administered 2020-04-08 – 2020-04-09 (×6): 600 mg via ORAL
  Filled 2020-04-08 (×6): qty 1

## 2020-04-08 MED ORDER — SERTRALINE HCL 100 MG PO TABS
200.0000 mg | ORAL_TABLET | Freq: Every day | ORAL | Status: DC
  Administered 2020-04-08: 200 mg via ORAL
  Filled 2020-04-08: qty 2

## 2020-04-08 MED ORDER — SENNOSIDES-DOCUSATE SODIUM 8.6-50 MG PO TABS
2.0000 | ORAL_TABLET | ORAL | Status: DC
  Administered 2020-04-09: 2 via ORAL
  Filled 2020-04-08: qty 2

## 2020-04-08 MED ORDER — OXYCODONE HCL 5 MG PO TABS: 5.0000 mg | ORAL_TABLET | ORAL | Status: DC | PRN

## 2020-04-08 MED ORDER — SIMETHICONE 80 MG PO CHEW: 80.0000 mg | CHEWABLE_TABLET | ORAL | Status: DC | PRN

## 2020-04-08 MED ORDER — PRENATAL MULTIVITAMIN CH
1.0000 | ORAL_TABLET | Freq: Every day | ORAL | Status: DC
  Administered 2020-04-08 – 2020-04-09 (×2): 1 via ORAL
  Filled 2020-04-08 (×2): qty 1

## 2020-04-08 MED ORDER — WITCH HAZEL-GLYCERIN EX PADS: 1.0000 "application " | MEDICATED_PAD | CUTANEOUS | Status: DC | PRN

## 2020-04-08 MED ORDER — ZOLPIDEM TARTRATE 5 MG PO TABS: 5.0000 mg | ORAL_TABLET | Freq: Every evening | ORAL | Status: DC | PRN

## 2020-04-08 MED ORDER — SODIUM CHLORIDE 0.9 % IV SOLN: INTRAVENOUS | Status: DC | PRN

## 2020-04-08 MED ORDER — LORATADINE 10 MG PO TABS
10.0000 mg | ORAL_TABLET | Freq: Every day | ORAL | Status: DC
  Administered 2020-04-08 – 2020-04-09 (×2): 10 mg via ORAL
  Filled 2020-04-08 (×2): qty 1

## 2020-04-08 MED ORDER — DIBUCAINE (PERIANAL) 1 % EX OINT: 1.0000 "application " | TOPICAL_OINTMENT | CUTANEOUS | Status: DC | PRN

## 2020-04-08 NOTE — Progress Notes (Signed)
Post Partum Day 1 Subjective: no complaints, up ad lib, voiding, tolerating PO and + flatus Feeling much better, no chills Objective: Blood pressure 103/70, pulse 86, temperature 97.9 F (36.6 C), temperature source Oral, resp. rate 18, height 5\' 4"  (1.626 m), weight 74.8 kg, SpO2 99 %, unknown if currently breastfeeding.  Physical Exam:  General: alert, cooperative and no distress Lochia: appropriate Uterine Fundus: firm Incision: healing well DVT Evaluation: No evidence of DVT seen on physical exam. Uterus NT  Recent Labs    04/07/20 2326 04/08/20 0553  HGB 11.3* 10.3*  HCT 35.6* 31.4*    Assessment/Plan: Plan for discharge tomorrow PP endometritis responding well to Gent/clinda ordered for 24 hours-will end early am tomorrow She states she has taken Augmentin multiple times without side effect Will order sertraline 200mg  po qhs and welbutrin XL 150 mg po qam  LOS: 1 day   06/09/20 II 04/08/2020, 9:50 AM

## 2020-04-08 NOTE — Progress Notes (Signed)
CSW received consult for hx of Anxiety and Post Partum Depression.  CSW met with MOB to offer support and complete assessment.    CSW congratulated MOB on the birth of infant. CSW advised MOB of CSW's role and the reason for CSW coming to speak with her. MOB reported that she dealt with PPD after the birth of her first child in 2018. MOB reported periods of extreme crying and feeling very down. MOB reported that this lasted a week for her and then she felt better. MOB does report that she is on Zoloft and Wellbutrin as well as sees a psychiatrist once or twice a year. MOB reports that she already has plans to follow up with psychiatrist as she just had baby.  MOB reported that she also has a hx of anxiety in which MOB reports that her  Medications help with it. MOB denies SI and HI and reported no DV to this CSW.  MOB reports that she has all needed items to care for infant with basinet for infant to sleep in once arrived home. MOB reported no other concerns at this time.   CSW provided education regarding the baby blues period vs. perinatal mood disorders, discussed treatment and gave resources for mental health follow up if concerns arise.  CSW recommends self-evaluation during the postpartum time period using the New Mom Checklist from Postpartum Progress and encouraged MOB to contact a medical professional if symptoms are noted at any time.   CSW provided review of Sudden Infant Death Syndrome (SIDS) precautions.   CSW identifies no further need for intervention and no barriers to discharge at this time.   Virgie Dad Ayme Short, MSW, LCSW [facility] [phone]

## 2020-04-08 NOTE — Anesthesia Postprocedure Evaluation (Signed)
Anesthesia Post Note  Patient: Michaela Sharp  Procedure(s) Performed: AN AD HOC LABOR EPIDURAL     Patient location during evaluation: Mother Baby Anesthesia Type: Epidural Level of consciousness: awake and alert Pain management: pain level controlled Vital Signs Assessment: post-procedure vital signs reviewed and stable Respiratory status: spontaneous breathing, nonlabored ventilation and respiratory function stable Cardiovascular status: stable Postop Assessment: no headache, no backache, epidural receding, no apparent nausea or vomiting, patient able to bend at knees, adequate PO intake and able to ambulate Anesthetic complications: no   No complications documented.  Last Vitals:  Vitals:   04/08/20 0335 04/08/20 0500  BP:  121/89  Pulse:  87  Resp:  19  Temp: 37.3 C 36.9 C  SpO2:  99%    Last Pain:  Vitals:   04/08/20 0500  TempSrc: Oral  PainSc: 0-No pain   Pain Goal:                   [name]

## 2020-04-09 LAB — SURGICAL PATHOLOGY

## 2020-04-09 MED ORDER — AMOXICILLIN-POT CLAVULANATE 875-125 MG PO TABS: 1.0000 | ORAL_TABLET | Freq: Two times a day (BID) | ORAL | 0 refills | Status: AC

## 2020-04-09 MED ORDER — IBUPROFEN 600 MG PO TABS: 600.0000 mg | ORAL_TABLET | Freq: Four times a day (QID) | ORAL | 0 refills | Status: AC

## 2020-04-09 MED ORDER — AMOXICILLIN-POT CLAVULANATE 875-125 MG PO TABS
1.0000 | ORAL_TABLET | Freq: Two times a day (BID) | ORAL | Status: DC
  Administered 2020-04-09: 1 via ORAL
  Filled 2020-04-09 (×3): qty 1

## 2020-04-09 NOTE — Discharge Summary (Signed)
   Postpartum Discharge Summary  Date of Service updated April 09, 2020     Patient Name: Michaela Sharp DOB: 10/15/1990 MRN: [id number]  Date of admission: 04/07/2020 Delivery date:04/07/2020  Delivering provider: LEGER, ELISE JENNIFER  Date of discharge: 04/09/2020  Admitting diagnosis: Pregnant [Z34.90] Intrauterine pregnancy: [redacted]w[redacted]d     Secondary diagnosis:  Active Problems:   Pregnant  Additional problems: chorioamnionitis    Discharge diagnosis: Term Pregnancy Delivered                                              Post partum procedures:antibiotics Augmentation: Pitocin Complications: None  Hospital course: Onset of Labor With Vaginal Delivery      29 y.o. yo G2P2002 at [redacted]w[redacted]d was admitted in Latent Labor on 04/07/2020. Patient had an uncomplicated labor course as follows:  Membrane Rupture Time/Date: 2:00 PM ,04/07/2020   Delivery Method:Vaginal, Spontaneous  Episiotomy: None  Lacerations:  2nd degree  Patient had an uncomplicated postpartum course.  She is ambulating, tolerating a regular diet, passing flatus, and urinating well. Patient is discharged home in stable condition on 04/09/20.  Newborn Data: Birth date:04/07/2020  Birth time:9:52 PM  Gender:Female  Living status:Living  Apgars:9 ,9  Weight:2946 g   Magnesium Sulfate received: No BMZ received: No Rhophylac:No MMR:No T-DaP:Given prenatally Flu: No Transfusion:No  Physical exam  Vitals:   04/08/20 0900 04/08/20 1228 04/08/20 2150 04/09/20 0603  BP: 103/70 96/72 (!) 120/92 114/80  Pulse: 86 75 93 79  Resp: 18  18 18  Temp: 97.9 F (36.6 C) 97.8 F (36.6 C) 97.8 F (36.6 C) 98.1 F (36.7 C)  TempSrc: Oral Oral Oral Oral  SpO2:   99%   Weight:      Height:       General: alert, cooperative and no distress Lochia: appropriate Uterine Fundus: firm Incision: N/A DVT Evaluation: No evidence of DVT seen on physical exam. Labs: Lab Results  Component Value Date   WBC 13.6 (H) 04/08/2020   HGB 10.3  (L) 04/08/2020   HCT 31.4 (L) 04/08/2020   MCV 96.3 04/08/2020   PLT 189 04/08/2020   CMP Latest Ref Rng & Units 04/07/2020  Glucose 70 - 99 mg/dL 101(H)  BUN 6 - 20 mg/dL 8  Creatinine 0.44 - 1.00 mg/dL 0.67  Sodium 135 - 145 mmol/L 133(L)  Potassium 3.5 - 5.1 mmol/L 4.5  Chloride 98 - 111 mmol/L 102  CO2 22 - 32 mmol/L 21(L)  Calcium 8.9 - 10.3 mg/dL 8.6(L)  Total Protein 6.5 - 8.1 g/dL 5.2(L)  Total Bilirubin 0.3 - 1.2 mg/dL 0.5  Alkaline Phos 38 - 126 U/L 186(H)  AST 15 - 41 U/L 34  ALT 0 - 44 U/L 18   Edinburgh Score: Edinburgh Postnatal Depression Scale Screening Tool 08/02/2017  I have been able to laugh and see the funny side of things. 0  I have looked forward with enjoyment to things. 0  I have blamed myself unnecessarily when things went wrong. 1  I have been anxious or worried for no good reason. 2  I have felt scared or panicky for no good reason. 0  Things have been getting on top of me. 1  I have been so unhappy that I have had difficulty sleeping. 0  I have felt sad or miserable. 1  I have been so unhappy that I have been   crying. 0  The thought of harming myself has occurred to me. 0  Edinburgh Postnatal Depression Scale Total 5      After visit meds:  Allergies as of 04/09/2020      Reactions   Other    Chromic sutures- states body pushes them out   Penicillins Hives, Itching   Has patient had a PCN reaction causing immediate rash, facial/tongue/throat swelling, SOB or lightheadedness with hypotension: Yes Has patient had a PCN reaction causing severe rash involving mucus membranes or skin necrosis: No Has patient had a PCN reaction that required hospitalization: No Has patient had a PCN reaction occurring within the last 10 years: No If all of the above answers are "NO", then may proceed with Cephalosporin use.      Medication List    TAKE these medications   buPROPion 150 MG 24 hr tablet Commonly known as: WELLBUTRIN XL Take 150 mg by mouth every  morning.   ibuprofen 600 MG tablet Commonly known as: ADVIL Take 1 tablet (600 mg total) by mouth every 6 (six) hours.   loratadine 10 MG tablet Commonly known as: CLARITIN Take 10 mg by mouth daily.   prenatal multivitamin Tabs tablet Take 1 tablet by mouth at bedtime.   sertraline 100 MG tablet Commonly known as: ZOLOFT Take 200 mg by mouth at bedtime.        Discharge home in stable condition Infant Feeding: Breast Infant Disposition:home with mother Discharge instruction: per After Visit Summary and Postpartum booklet. Activity: Advance as tolerated. Pelvic rest for 6 weeks.  Diet: routine diet Anticipated Birth Control: Unsure Postpartum Appointment:6 weeks Additional Postpartum F/U: not applicable Future Appointments:No future appointments. Follow up Visit:      04/09/2020 Michelle L Grewal, MD  

## 2020-04-14 DIAGNOSIS — Z6824 Body mass index (BMI) 24.0-24.9, adult: Secondary | ICD-10-CM | POA: Diagnosis not present

## 2020-04-14 DIAGNOSIS — R05 Cough: Secondary | ICD-10-CM | POA: Diagnosis not present

## 2020-04-14 DIAGNOSIS — R0781 Pleurodynia: Secondary | ICD-10-CM | POA: Diagnosis not present

## 2020-04-15 DIAGNOSIS — R918 Other nonspecific abnormal finding of lung field: Secondary | ICD-10-CM | POA: Diagnosis not present

## 2020-04-15 DIAGNOSIS — K219 Gastro-esophageal reflux disease without esophagitis: Secondary | ICD-10-CM | POA: Diagnosis not present

## 2020-04-15 DIAGNOSIS — F418 Other specified anxiety disorders: Secondary | ICD-10-CM | POA: Diagnosis not present

## 2020-04-15 DIAGNOSIS — R079 Chest pain, unspecified: Secondary | ICD-10-CM | POA: Diagnosis not present

## 2020-04-15 DIAGNOSIS — R091 Pleurisy: Secondary | ICD-10-CM | POA: Diagnosis not present

## 2020-04-15 DIAGNOSIS — R05 Cough: Secondary | ICD-10-CM | POA: Diagnosis not present

## 2020-04-15 DIAGNOSIS — R0789 Other chest pain: Secondary | ICD-10-CM | POA: Diagnosis not present

## 2020-04-20 DIAGNOSIS — F3342 Major depressive disorder, recurrent, in full remission: Secondary | ICD-10-CM | POA: Diagnosis not present

## 2020-04-21 DIAGNOSIS — Z23 Encounter for immunization: Secondary | ICD-10-CM | POA: Diagnosis not present

## 2020-05-19 DIAGNOSIS — Z23 Encounter for immunization: Secondary | ICD-10-CM | POA: Diagnosis not present

## 2020-05-27 DIAGNOSIS — Z1389 Encounter for screening for other disorder: Secondary | ICD-10-CM | POA: Diagnosis not present

## 2020-06-08 DIAGNOSIS — M6281 Muscle weakness (generalized): Secondary | ICD-10-CM | POA: Diagnosis not present

## 2020-06-08 DIAGNOSIS — R159 Full incontinence of feces: Secondary | ICD-10-CM | POA: Diagnosis not present

## 2020-06-08 DIAGNOSIS — R152 Fecal urgency: Secondary | ICD-10-CM | POA: Diagnosis not present

## 2020-06-08 DIAGNOSIS — N819 Female genital prolapse, unspecified: Secondary | ICD-10-CM | POA: Diagnosis not present

## 2020-06-15 DIAGNOSIS — M6281 Muscle weakness (generalized): Secondary | ICD-10-CM | POA: Diagnosis not present

## 2020-06-15 DIAGNOSIS — K5900 Constipation, unspecified: Secondary | ICD-10-CM | POA: Diagnosis not present

## 2020-06-15 DIAGNOSIS — R159 Full incontinence of feces: Secondary | ICD-10-CM | POA: Diagnosis not present

## 2020-06-15 DIAGNOSIS — R3915 Urgency of urination: Secondary | ICD-10-CM | POA: Diagnosis not present

## 2020-06-15 DIAGNOSIS — N819 Female genital prolapse, unspecified: Secondary | ICD-10-CM | POA: Diagnosis not present

## 2020-06-29 DIAGNOSIS — M6281 Muscle weakness (generalized): Secondary | ICD-10-CM | POA: Diagnosis not present

## 2020-06-29 DIAGNOSIS — R3915 Urgency of urination: Secondary | ICD-10-CM | POA: Diagnosis not present

## 2020-06-29 DIAGNOSIS — N819 Female genital prolapse, unspecified: Secondary | ICD-10-CM | POA: Diagnosis not present

## 2020-06-29 DIAGNOSIS — R159 Full incontinence of feces: Secondary | ICD-10-CM | POA: Diagnosis not present

## 2020-07-20 DIAGNOSIS — M6281 Muscle weakness (generalized): Secondary | ICD-10-CM | POA: Diagnosis not present

## 2020-07-20 DIAGNOSIS — M62838 Other muscle spasm: Secondary | ICD-10-CM | POA: Diagnosis not present

## 2020-07-20 DIAGNOSIS — R159 Full incontinence of feces: Secondary | ICD-10-CM | POA: Diagnosis not present

## 2020-07-20 DIAGNOSIS — K5900 Constipation, unspecified: Secondary | ICD-10-CM | POA: Diagnosis not present

## 2020-07-20 DIAGNOSIS — R12 Heartburn: Secondary | ICD-10-CM | POA: Diagnosis not present

## 2020-07-20 DIAGNOSIS — R3915 Urgency of urination: Secondary | ICD-10-CM | POA: Diagnosis not present

## 2020-07-20 DIAGNOSIS — N819 Female genital prolapse, unspecified: Secondary | ICD-10-CM | POA: Diagnosis not present

## 2020-08-03 DIAGNOSIS — R3915 Urgency of urination: Secondary | ICD-10-CM | POA: Diagnosis not present

## 2020-08-03 DIAGNOSIS — R152 Fecal urgency: Secondary | ICD-10-CM | POA: Diagnosis not present

## 2020-08-03 DIAGNOSIS — M62838 Other muscle spasm: Secondary | ICD-10-CM | POA: Diagnosis not present

## 2020-08-03 DIAGNOSIS — N819 Female genital prolapse, unspecified: Secondary | ICD-10-CM | POA: Diagnosis not present

## 2020-08-03 DIAGNOSIS — K5900 Constipation, unspecified: Secondary | ICD-10-CM | POA: Diagnosis not present

## 2020-08-03 DIAGNOSIS — M6281 Muscle weakness (generalized): Secondary | ICD-10-CM | POA: Diagnosis not present

## 2020-08-03 DIAGNOSIS — R159 Full incontinence of feces: Secondary | ICD-10-CM | POA: Diagnosis not present

## 2020-08-10 DIAGNOSIS — R3915 Urgency of urination: Secondary | ICD-10-CM | POA: Diagnosis not present

## 2020-08-10 DIAGNOSIS — R152 Fecal urgency: Secondary | ICD-10-CM | POA: Diagnosis not present

## 2020-08-10 DIAGNOSIS — M6281 Muscle weakness (generalized): Secondary | ICD-10-CM | POA: Diagnosis not present

## 2020-08-10 DIAGNOSIS — M62838 Other muscle spasm: Secondary | ICD-10-CM | POA: Diagnosis not present

## 2020-08-10 DIAGNOSIS — R159 Full incontinence of feces: Secondary | ICD-10-CM | POA: Diagnosis not present

## 2020-08-10 DIAGNOSIS — K5900 Constipation, unspecified: Secondary | ICD-10-CM | POA: Diagnosis not present

## 2020-08-10 DIAGNOSIS — N819 Female genital prolapse, unspecified: Secondary | ICD-10-CM | POA: Diagnosis not present

## 2020-08-17 DIAGNOSIS — R159 Full incontinence of feces: Secondary | ICD-10-CM | POA: Diagnosis not present

## 2020-08-17 DIAGNOSIS — M6281 Muscle weakness (generalized): Secondary | ICD-10-CM | POA: Diagnosis not present

## 2020-08-17 DIAGNOSIS — K5900 Constipation, unspecified: Secondary | ICD-10-CM | POA: Diagnosis not present

## 2020-08-17 DIAGNOSIS — N819 Female genital prolapse, unspecified: Secondary | ICD-10-CM | POA: Diagnosis not present

## 2020-08-17 DIAGNOSIS — R152 Fecal urgency: Secondary | ICD-10-CM | POA: Diagnosis not present

## 2020-08-17 DIAGNOSIS — M62838 Other muscle spasm: Secondary | ICD-10-CM | POA: Diagnosis not present

## 2020-08-17 DIAGNOSIS — R3915 Urgency of urination: Secondary | ICD-10-CM | POA: Diagnosis not present

## 2020-09-24 DIAGNOSIS — U071 COVID-19: Secondary | ICD-10-CM | POA: Diagnosis not present

## 2020-09-24 DIAGNOSIS — Z20828 Contact with and (suspected) exposure to other viral communicable diseases: Secondary | ICD-10-CM | POA: Diagnosis not present

## 2020-10-18 DIAGNOSIS — U071 COVID-19: Secondary | ICD-10-CM | POA: Diagnosis not present

## 2020-10-18 DIAGNOSIS — Z20828 Contact with and (suspected) exposure to other viral communicable diseases: Secondary | ICD-10-CM | POA: Diagnosis not present

## 2020-11-02 IMAGING — CT CT ORBITS W/ CM
3 of 6 series · 13 of 47 positions shown, 15 images · IV contrast (APPLIED)
Comparison: None.

CLINICAL DATA: 29-year-old female is pregnant in the 3rd trimester
with right eye redness and drainage. Sinus pain, infection. Cough.

EXAM:
CT ORBITS WITH CONTRAST
TECHNIQUE: Multidetector CT images was performed according to the standard
protocol following intravenous contrast administration.
CONTRAST:  75mL OMNIPAQUE IOHEXOL 300 MG/ML  SOLN

[Series 3: maxilllofacial 2.0 hr40 3 · axial · 0.34mm/px · z∈[+1242,+1310]mm · 8 of 40 slices shown, 10 images]
[im 3/40  brain]
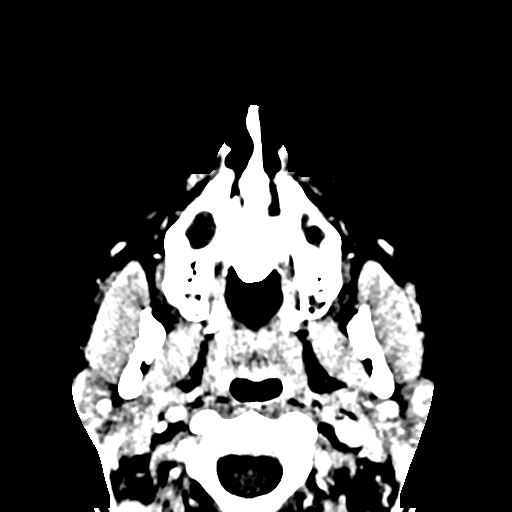
[im 3/40  bone]
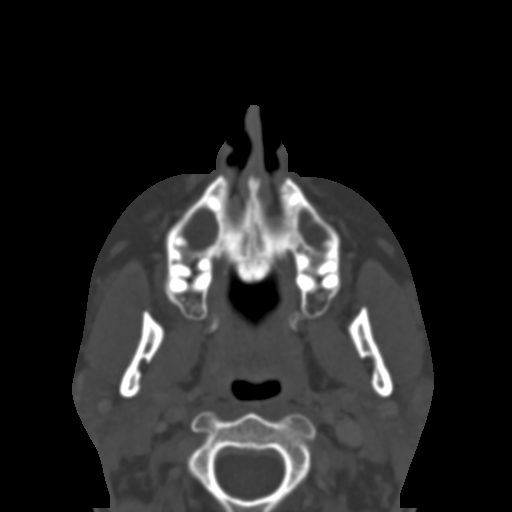
[im 9/40  bone]
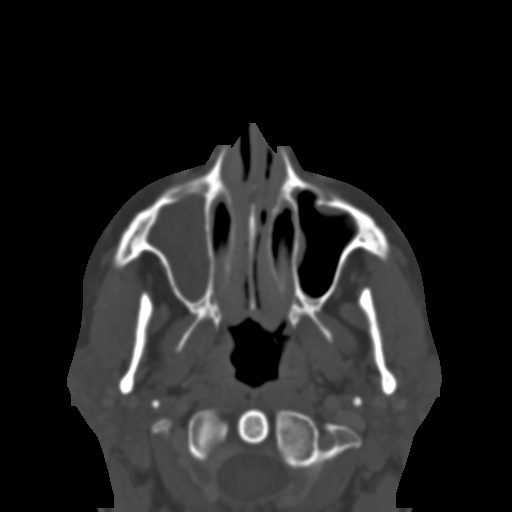
[im 14/40  bone]
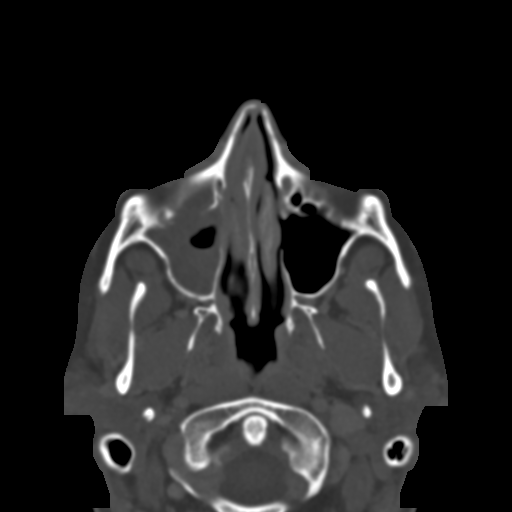
[im 17/40  bone]
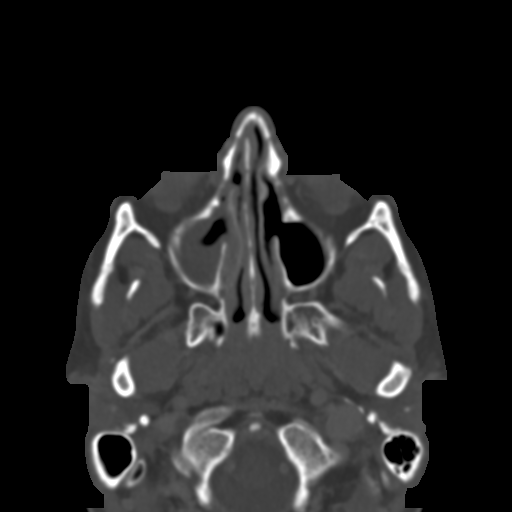
[im 23/40  brain]
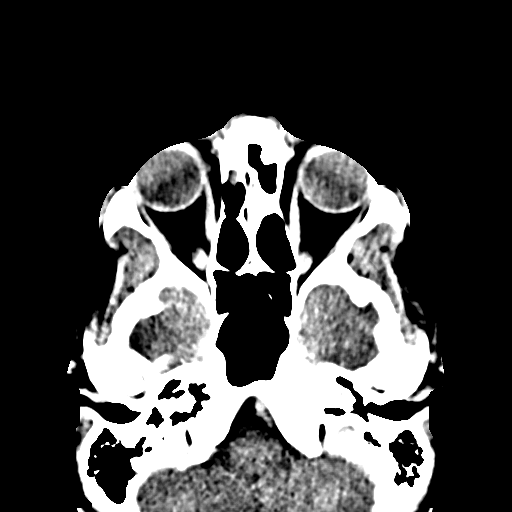
[im 23/40  bone]
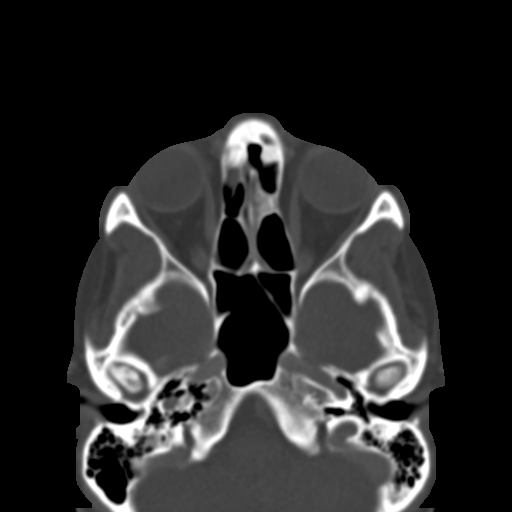
[im 26/40  bone]
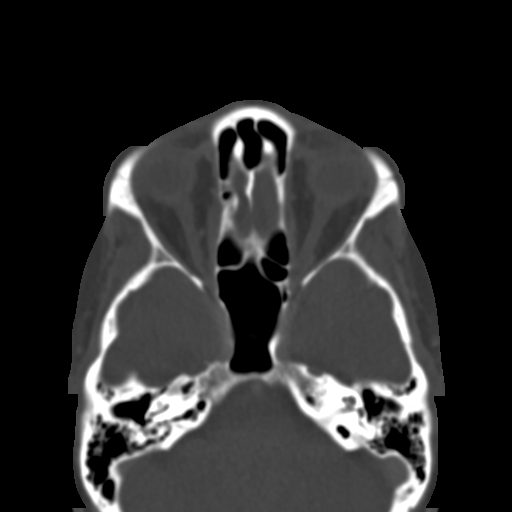
[im 31/40  bone]
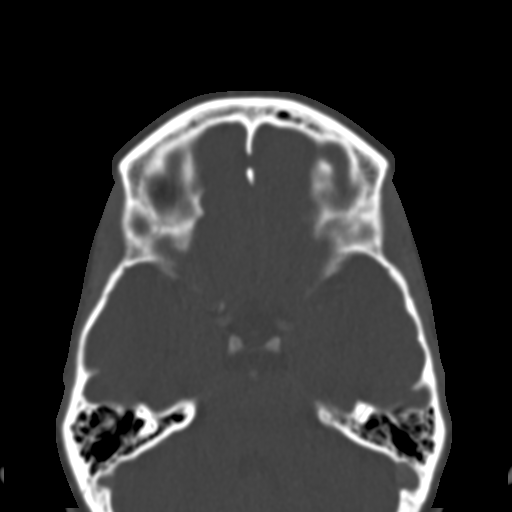
[im 37/40  bone]
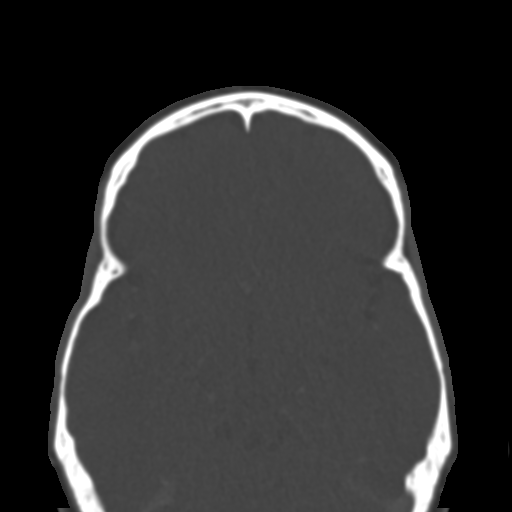

[Series 9: bone cor · coronal · 0.20mm/px · 3 of 69 slices shown]
[im 18/69  bone]
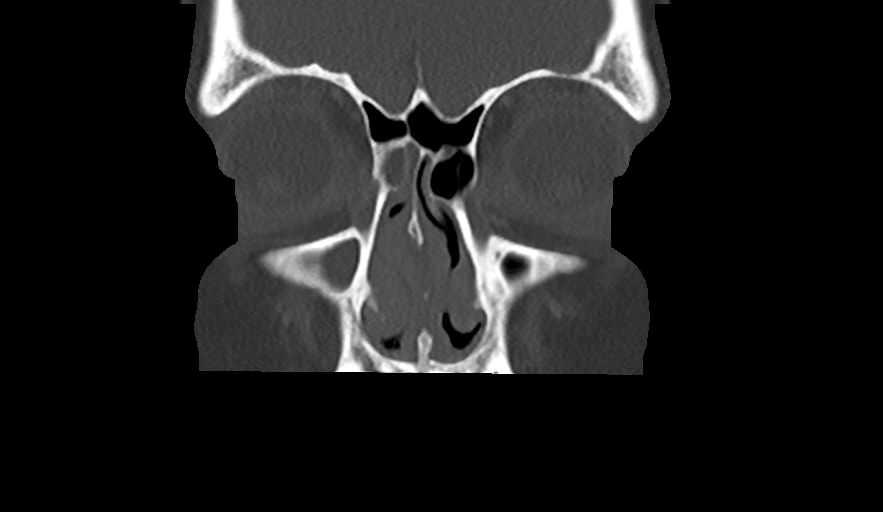
[im 35/69  bone]
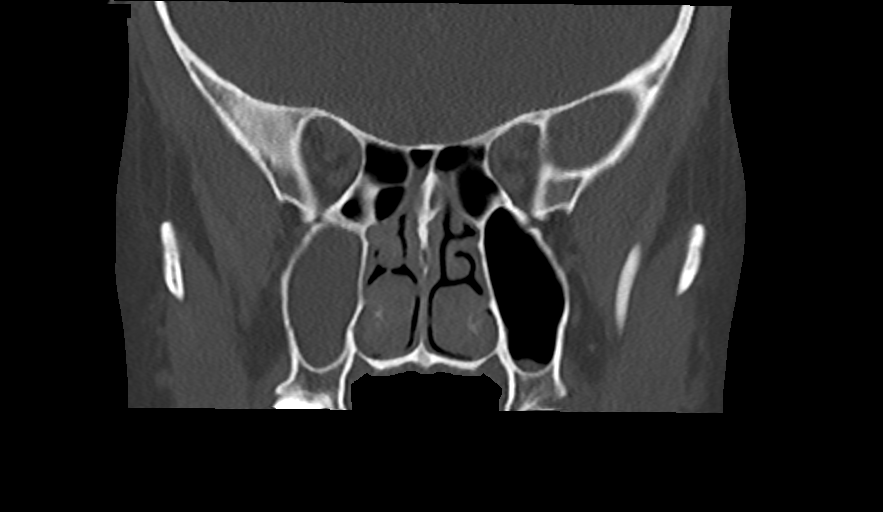
[im 52/69  bone]
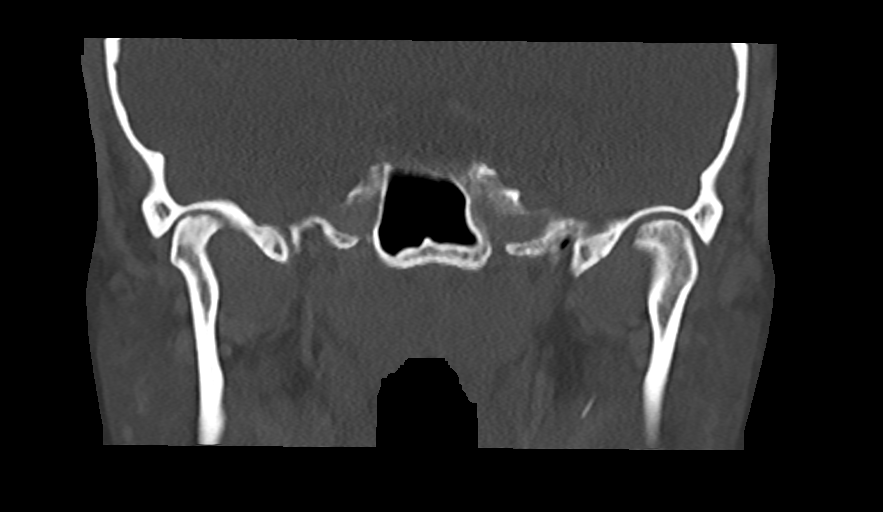

[Series 10: bone sag · sagittal · 0.20mm/px · 2 of 88 slices shown]
[im 30/88  bone]
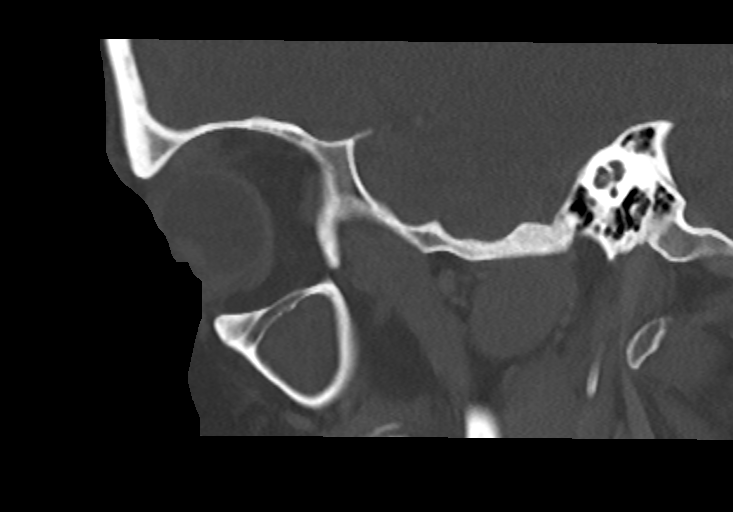
[im 59/88  bone]
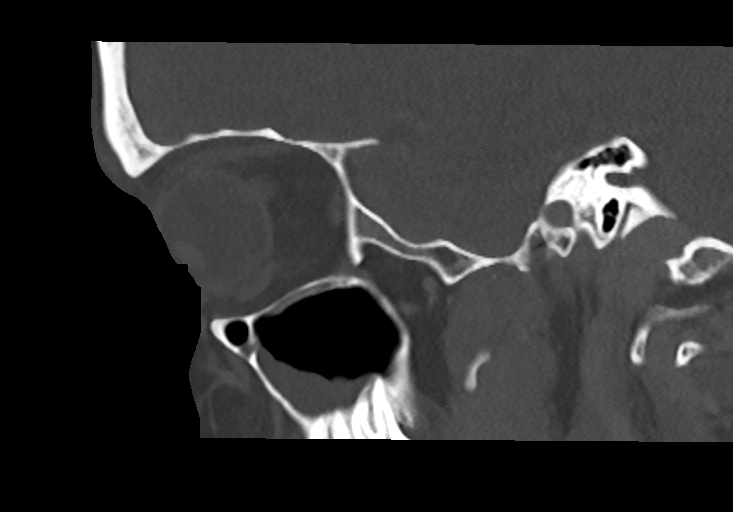

[13 of 47 positions shown; findings below may reference images not displayed]

FINDINGS: Orbits:

Intact orbital walls. Globes and intraorbital soft tissues appears
symmetric and normal.

No convincing asymmetric preseptal orbital soft tissue thickening or
swelling is identified.

Other Osseous Structures: No acute maxillary dental finding is
evident. Skull base appears intact. No acute osseous abnormality
identified.

Visualized sinuses: Subtotal low-density opacification of the right
maxillary sinus with evidence of a fluid level (series 5, image 14).
Contralateral mild to moderate left maxillary alveolar recess
low-density mucosal thickening. No retro maxillary soft tissue
inflammation or other complicating features identified.

There is also mucosal thickening at the right frontoethmoidal recess
but other paranasal sinuses are well pneumatized.

There is symmetric appearing nasal cavity mucosal thickening with
some retained secretions.

Bilateral tympanic cavities and mastoids are clear.

Soft tissues: Negative visible pharynx, parapharyngeal spaces,
masticator and parotid spaces.

The visible major vascular structures at the skull base appear to be
patent, including the cavernous sinus.

Limited intracranial: Negative.
IMPRESSION: 1. Right maxillary sinusitis with evidence of Rhinitis and mild left
maxillary sinus inflammation. But no complicating features
identified.
2. Symmetric and negative CT appearance of the Orbits.

## 2020-12-02 DIAGNOSIS — H9201 Otalgia, right ear: Secondary | ICD-10-CM | POA: Diagnosis not present

## 2020-12-02 DIAGNOSIS — Z6823 Body mass index (BMI) 23.0-23.9, adult: Secondary | ICD-10-CM | POA: Diagnosis not present

## 2020-12-02 DIAGNOSIS — H1032 Unspecified acute conjunctivitis, left eye: Secondary | ICD-10-CM | POA: Diagnosis not present

## 2020-12-06 DIAGNOSIS — Z6823 Body mass index (BMI) 23.0-23.9, adult: Secondary | ICD-10-CM | POA: Diagnosis not present

## 2020-12-06 DIAGNOSIS — J329 Chronic sinusitis, unspecified: Secondary | ICD-10-CM | POA: Diagnosis not present

## 2020-12-06 DIAGNOSIS — J40 Bronchitis, not specified as acute or chronic: Secondary | ICD-10-CM | POA: Diagnosis not present
